# Patient Record
Sex: Male | Born: 1935 | Race: White | Hispanic: No | Marital: Married | Smoking: Never smoker
Health system: Southern US, Community
[De-identification: ages and names within clinical notes are randomized; demographics above are authoritative.]

## PROBLEM LIST (undated history)

## (undated) DIAGNOSIS — Z950 Presence of cardiac pacemaker: Secondary | ICD-10-CM

## (undated) DIAGNOSIS — I441 Atrioventricular block, second degree: Secondary | ICD-10-CM

## (undated) DIAGNOSIS — E669 Obesity, unspecified: Secondary | ICD-10-CM

## (undated) DIAGNOSIS — N200 Calculus of kidney: Secondary | ICD-10-CM

## (undated) DIAGNOSIS — I10 Essential (primary) hypertension: Secondary | ICD-10-CM

## (undated) DIAGNOSIS — E119 Type 2 diabetes mellitus without complications: Secondary | ICD-10-CM

## (undated) DIAGNOSIS — E785 Hyperlipidemia, unspecified: Secondary | ICD-10-CM

## (undated) DIAGNOSIS — G600 Hereditary motor and sensory neuropathy: Secondary | ICD-10-CM

## (undated) HISTORY — DX: Calculus of kidney: N20.0

## (undated) HISTORY — DX: Obesity, unspecified: E66.9

## (undated) HISTORY — DX: Essential (primary) hypertension: I10

## (undated) HISTORY — DX: Presence of cardiac pacemaker: Z95.0

## (undated) HISTORY — DX: Atrioventricular block, second degree: I44.1

## (undated) HISTORY — DX: Type 2 diabetes mellitus without complications: E11.9

## (undated) HISTORY — PX: SHOULDER ARTHROSCOPY: SHX128

## (undated) HISTORY — PX: KIDNEY STONE SURGERY: SHX686

## (undated) HISTORY — DX: Hyperlipidemia, unspecified: E78.5

## (undated) HISTORY — DX: Hereditary motor and sensory neuropathy: G60.0

---

## 1999-07-05 ENCOUNTER — Ambulatory Visit (HOSPITAL_BASED_OUTPATIENT_CLINIC_OR_DEPARTMENT_OTHER): Admission: RE | Admit: 1999-07-05 | Discharge: 1999-07-05 | Payer: Self-pay | Admitting: Otolaryngology

## 2004-05-23 ENCOUNTER — Ambulatory Visit (HOSPITAL_COMMUNITY): Admission: RE | Admit: 2004-05-23 | Discharge: 2004-05-23 | Payer: Self-pay | Admitting: Orthopedic Surgery

## 2004-05-23 ENCOUNTER — Ambulatory Visit (HOSPITAL_BASED_OUTPATIENT_CLINIC_OR_DEPARTMENT_OTHER): Admission: RE | Admit: 2004-05-23 | Discharge: 2004-05-23 | Payer: Self-pay | Admitting: Orthopedic Surgery

## 2005-02-21 ENCOUNTER — Ambulatory Visit (HOSPITAL_COMMUNITY): Admission: RE | Admit: 2005-02-21 | Discharge: 2005-02-21 | Payer: Self-pay | Admitting: Gastroenterology

## 2005-02-21 ENCOUNTER — Encounter (INDEPENDENT_AMBULATORY_CARE_PROVIDER_SITE_OTHER): Payer: Self-pay | Admitting: Specialist

## 2007-06-06 ENCOUNTER — Ambulatory Visit (HOSPITAL_COMMUNITY): Admission: RE | Admit: 2007-06-06 | Discharge: 2007-06-06 | Payer: Self-pay | Admitting: Urology

## 2007-06-20 ENCOUNTER — Ambulatory Visit (HOSPITAL_COMMUNITY): Admission: RE | Admit: 2007-06-20 | Discharge: 2007-06-20 | Payer: Self-pay | Admitting: Urology

## 2007-06-26 ENCOUNTER — Emergency Department (HOSPITAL_COMMUNITY): Admission: EM | Admit: 2007-06-26 | Discharge: 2007-06-26 | Payer: Self-pay | Admitting: Emergency Medicine

## 2011-05-02 NOTE — Op Note (Signed)
NAME:  Calvin Kemp, Calvin Kemp               ACCOUNT NO.:  [id number]   MEDICAL RECORD NO.:  [id number]          PATIENT TYPE:  AMB   LOCATION:  DAY                          FACILITY:  [facility]   PHYSICIAN:  Mark C. Vernie Ammons, M.D.  DATE OF BIRTH:  [birth year]   DATE OF PROCEDURE:  06/06/2007  DATE OF DISCHARGE:                               OPERATIVE REPORT   PREOPERATIVE DIAGNOSIS:  Right ureteral calculus.   POSTOPERATIVE DIAGNOSES:  1. Right ureteral calculus.  2. Right mid ureteral stricture.   PROCEDURE:  Cystoscopy, right retrograde pyelogram with interpretation,  right ureteroscopy and double-J stent placement.   SURGEON:  Mark C. Vernie Ammons, M.D.   ANESTHESIA:  General.   DRAINS:  6-French 26 cm double-J stent the right ureter.  No string.   SPECIMENS:  None.   BLOOD LOSS:  Minimal.   COMPLICATIONS:  None.   INDICATIONS:  The patient is a 75 year old white male with a known right  ureteral calculus.  It was initially demonstrated on CT scan to be 4 mm  in size and in the right proximal ureter with hydronephrosis.  He had  had persistent pain and is therefore brought to the OR today for  ureteroscopic extraction of the stone.  The risks, complications and  alternatives were discussed, he understands and elected to proceed.   DESCRIPTION OF OPERATION:  After informed consent, the patient brought  to major OR, placed table, administered general anesthesia and moved to  the dorsal lithotomy position.  His genitalia was sterilely prepped and  draped and a 6-French rigid ureteroscope was then passed per urethra  into the bladder.  The bladder was inspected and noted be free of any  tumor, stones or inflammatory lesions.  The right orifice was identified  and the scope was easily passed into the right orifice and up several  centimeters in the right ureter.  A stone was not visualized.   Through the ureteroscope I performed retrograde pyelogram in standard  fashioned using full  strength contrast.  The contrast was injected  through the scope and under direct fluoroscopic visualization I noted  the contrast to pass through an area that I thought was crossing of the  iliac vessels but it seemed a little high.  It was just at the lower  border of the sacrum where there appeared to be narrowed area in the  ureter.  Proximal to that there was some hydronephrosis.  I did not  definitely discern the stone as a filling defect due to body habitus on  fluoroscopy.  There is pyelocaliectasis seen but no filling defects or  mass effect of the kidney.   I passed a 0.038 inch floppy tip guidewire up the right ureter under  direct fluoroscopic control.  I then withdrew the ureteroscope.  I was  removed the ureteroscope and then reinserted the ureteroscope next to  the guidewire and a second guidewire was used as a guide to attempt to  pass the scope up the ureter but it met a great deal of resistance at  the level previously noted at the lower border of  the sacrum.  I  therefore removed the ureteroscope and passed a ureteral dilating  balloon over the guidewire, however, I could not get the dilating  balloon to pass through the strictured area.  I therefore removed the  dilating balloon and used a ureteral access sheath and then used just  the inner cannula, passed this over the guidewire and it too held up in  what appears to be a very tight stricture of the ureter.  I could not  easily pass this over the guidewire on up the ureter so I therefore  removed it and reinserted the ureteroscope and tried to visualize the  area.  I could not see any definite stone impacted there and it appeared  to just be stricture.  I passed the dilating balloon catheter again over  the guidewire next, next to the ureteroscope and tried to visualize  passing this through the strictured region but was unsuccessful.   I removed the ureteroscope and the dilating balloon as well as one of  the  guidewires and back loaded the cystoscope over the other guidewire  and passed this.  I then inserted the cystoscope in the bladder and  passed the double-J stent over the guidewire into the area of the renal  pelvis without difficulty and then removed the guidewire with good curl  being noted in the bladder and the renal pelvis region.  I drained the  bladder and removed the cystoscope and the patient was awakened and  taken to recovery room in stable satisfactory condition.  He tolerated  procedure well with no intraoperative complications.   He will be given a prescription for 10 mg Toradol  #24.  He will follow-  up in the office to see Dr. Annabell Howells or myself in 1 week with plan to allow  the stent to hopefully soften the strictured region and allow reattempt  at ureteroscopy and stone extraction at that time.      Mark C. Vernie Ammons, M.D.  Electronically Signed     MCO/MEDQ  D:  06/06/2007  T:  06/07/2007  Job:  [id number]

## 2011-05-05 NOTE — Op Note (Signed)
NAME:  Calvin Kemp, Calvin Kemp NO.:  [id number]   MEDICAL RECORD NO.:  [id number]          PATIENT TYPE:  AMB   LOCATION:  ENDO                         FACILITY:  [facility]   PHYSICIAN:  Graylin Shiver, M.D.   DATE OF BIRTH:  01-07-1936   DATE OF PROCEDURE:  02/21/2005  DATE OF DISCHARGE:                                 OPERATIVE REPORT   PROCEDURE:  Colonoscopy with biopsy.   INDICATIONS FOR PROCEDURE:  Screening.   Informed consent was obtained after explantation of the risks of bleeding,  infection, and perforation.   PREMEDICATIONS:  1.  Fentanyl 100 mcg IV.  2.  Versed 10 mg IV.   PROCEDURE:  With the patient in the left lateral decubitus position, a  rectal exam was performed.  No masses were felt. The Olympus colonoscope was  inserted into the rectum and advanced around the colon to the cecum.  Cecal  landmarks were identified.  The cecum and ascending colon were normal, the  transverse colon normal.  The descending colon and sigmoid revealed  diverticulosis.  In the distal sigmoid, there were two tiny polyps that were  biopsied off with cold forceps.  The rectum was normal.  He tolerated the  procedure well, without complications.   IMPRESSION:  1.  Diverticulosis.  2.  Two tiny polyps in the sigmoid which were biopsied off.   PLAN:  The pathology will be checked.      SFG/MEDQ  D:  02/21/2005  T:  02/21/2005  Job:  [id number]   cc:   Chales Salmon. Abigail Miyamoto, M.D.  [address]  [place]  [place] [address]  Fax: [phone]

## 2011-05-05 NOTE — Op Note (Signed)
NAME:  Calvin Kemp, Calvin Kemp                         ACCOUNT NO.:  [id number]   MEDICAL RECORD NO.:  [id number]                   PATIENT TYPE:  AMB   LOCATION:  DSC                                  FACILITY:  [facility]   PHYSICIAN:  Bralon A. Thurston Hole, M.D.              DATE OF BIRTH:  [birth year]   DATE OF PROCEDURE:  05/23/2004  DATE OF DISCHARGE:                                 OPERATIVE REPORT   PREOPERATIVE DIAGNOSIS:  Right shoulder partial rotator cuff tear with  labrum tear and impingement.   POSTOPERATIVE DIAGNOSES:  1. Right shoulder partial labrum tear.  2. Right shoulder impingement.  3. Right shoulder acromioclavicular joint spurring-degenerative joint     disease.   PROCEDURES:  1. Right shoulder examination under anesthesia, followed by arthroscopic     debridement, partial labral tear.  2. Right shoulder subacromial decompression.  3. Right shoulder distal clavicle excision.   SURGEON:  Elana Alm. Thurston Hole, M.D.   ASSISTANT:  Julien Girt, P.A.   ANESTHESIA:  General.   OPERATIVE TIME:  45 minutes.   COMPLICATIONS:  None.   INDICATION FOR PROCEDURE:  Calvin Kemp is a 75 year old gentleman who has had  a year of right shoulder pain increasing in nature with exam indicative of  and x-ray showing significant spurring and impingement, subacromial space  and AC joint consistent with rotator cuff tendinitis and impingement, who  has failed conservative care and is now to undergo arthroscopy.   DESCRIPTION:  Calvin Kemp was brought to the operating room on May 23, 2004,  after an interscalene block had been placed in the holding room by  anesthesia for postoperative pain control.  He was placed on the operating  table in the supine position.  After being placed under general anesthesia,  his right shoulder was examined under anesthesia.  He had full range of  motion and the shoulder was stable to ligamentous exam.  He was then placed  in a beach chair position and his  shoulder and arm were prepped using  sterile Duraprep and draped using sterile technique.  Originally through a  posterior arthroscopic portal, the arthroscope with a pump attached was  placed in through an anterior portal, and an arthroscopic probe was placed.  On initial inspection the articular cartilage in the glenohumeral joint  showed mild grade 2 and 3 chondromalacia, which was debrided.  He had  partial tearing of the anterior, superior, inferior, and posterior labrum,  25-30%, and this was debrided.  The biceps tendon anchor and biceps tendon  were intact.  Anterior inferior glenohumeral ligament complex was intact.  Rotator cuff was thoroughly inspected, and there was no evidence of a tear  on the articular surface.  Inferior capsular recess was free of pathology.  Subacromial space was entered and a lateral arthroscopic portal was made.  A  moderately thickened bursitis was resected.  The rotator cuff was somewhat  inflamed and  thickened underneath this but no evidence of a tear.  Impingement was noted, and a subacromial decompression was carried out as  well as a CA ligament release.  The [facility] joint showed significant spurring and  degenerative changes, and the distal 5-6 mm of clavicle was resected with a  6 mm bur.  After this was done, the shoulder could be brought through a full  range of motion with no impingement on the rotator cuff.  At this point it  was felt that all pathology had been satisfactorily addressed.  The  instruments were removed.  Portals closed with 3-0 nylon suture, sterile  dressings and a sling applied, the patient was awakened and taken to  recovery in stable condition.   FOLLOW-UP CARE:  Calvin Kemp will be followed as an outpatient on Vicodin and  Naprosyn with early physical therapy.  See him back in my office in a week  for sutures out and follow-up.                                               Prentice A. Thurston Hole, M.D.    RAW/MEDQ  D:   05/23/2004  T:  05/23/2004  Job:  [id number]

## 2011-09-21 ENCOUNTER — Ambulatory Visit: Payer: Self-pay | Admitting: Internal Medicine

## 2011-10-02 ENCOUNTER — Institutional Professional Consult (permissible substitution): Payer: Self-pay | Admitting: Internal Medicine

## 2011-10-04 LAB — HEMOGLOBIN AND HEMATOCRIT, BLOOD
HCT: 39.3
Hemoglobin: 13.1

## 2011-10-04 LAB — BASIC METABOLIC PANEL
BUN: 17
CO2: 23
Calcium: 8.7
Chloride: 106
Creatinine, Ser: 1.72 — ABNORMAL HIGH
GFR calc Af Amer: 48 — ABNORMAL LOW
GFR calc non Af Amer: 40 — ABNORMAL LOW
Glucose, Bld: 211 — ABNORMAL HIGH
Potassium: 3.9
Sodium: 137

## 2011-10-26 ENCOUNTER — Telehealth: Payer: Self-pay | Admitting: Internal Medicine

## 2011-10-26 ENCOUNTER — Institutional Professional Consult (permissible substitution): Payer: Self-pay | Admitting: Internal Medicine

## 2011-10-26 ENCOUNTER — Ambulatory Visit: Payer: Self-pay | Admitting: Internal Medicine

## 2011-10-26 NOTE — Telephone Encounter (Signed)
Discussed with Dr Johney Frame  Pt has had 2 appointments scheduled and cancel both  We can reschedule but it will be next available  If he has problems prior he can go to the ER

## 2011-10-26 NOTE — Telephone Encounter (Signed)
New message:  Pt had appt but cancelled and then passed out last night at home.  Now he is wanting to be seen ASAP.  Please call and advise.

## 2011-11-15 ENCOUNTER — Ambulatory Visit (INDEPENDENT_AMBULATORY_CARE_PROVIDER_SITE_OTHER): Payer: Medicare Other | Admitting: Internal Medicine

## 2011-11-15 ENCOUNTER — Encounter: Payer: Self-pay | Admitting: *Deleted

## 2011-11-15 ENCOUNTER — Encounter: Payer: Self-pay | Admitting: Internal Medicine

## 2011-11-15 VITALS — BP 148/62 | HR 52 | Resp 18 | Ht 72.0 in | Wt 218.4 lb

## 2011-11-15 DIAGNOSIS — I442 Atrioventricular block, complete: Secondary | ICD-10-CM | POA: Insufficient documentation

## 2011-11-15 DIAGNOSIS — I459 Conduction disorder, unspecified: Secondary | ICD-10-CM

## 2011-11-15 DIAGNOSIS — I441 Atrioventricular block, second degree: Secondary | ICD-10-CM

## 2011-11-15 NOTE — Assessment & Plan Note (Signed)
The patient has mobitz II second degree AV block.  He has frequent 2:1 AV conduction including on his EKG today.  He has had multiple episodes of presyncope and also an episode of syncope recently.  He has a clear indication for pacemaker implantation at this time. I have strongly recommended pacemaker implantation urgently (within the next 48 hours).  I have also made it very clear that he could have further syncope resulting in head/neck injury, hip fraction, etc.  I have explained that his AV conduction is at this point not reliable and could result in sudden death.  Risks, benefits, alternatives to pacemaker implantation were discussed in detail with the patient and his spouse today. The patient understands that the risks include but are not limited to bleeding, infection, pneumothorax, perforation, tamponade, vascular damage, renal failure, MI, stroke, death,  and lead dislodgement and wishes to proceed.  Unfortunately, I could not convince him to have the procedure done urgently (as I have strongly recommended).  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  I offered to have the procedure done this week and that he could recover over the weekend and likely be back to work by Monday.  Unfortunately, he declines.  He has agreed to proceed with pacemaker implantation on Dec 13th.  I have instructed him to not drive in there interim. He is instructed to report immediately to [facility] for urgent [facility]PM should further presyncope or syncope occur.

## 2011-11-15 NOTE — Progress Notes (Signed)
Primary Care Physician: ROSS,CHARLES ALAN, MD Referring Physician:  Dr Skains   Calvin Kemp is a 75 y.o. male with a h/o DM and HTN who presents today for EP consultation.  He reports being in good healthy.  He was working on a ladder 8/12.  He began having episodes of a "rushing" sensation.  He describes this as a warmth with presyncope.  He began having episodes of similar presyncope 1-2 times per week.  Episodes typically lasts only several seconds.  He was evaluated by Dr Skains and had a 24 hour monitor placed.  This documented episodes of mobitz II second degree AV block with occasional 2:1 AV block.  He was encouraged to proceed with pacemaker implantation but declined.  He did pretty well until about 2 weeks ago when he had syncope.  He reports that he was walking into his bathroom when he felt the same "rush".  He says that the next thing he knew, he was getting up off of the floor after passing out.  He has had no further syncope but reports presyncope.  Today, he denies symptoms of palpitations, chest pain, shortness of breath, orthopnea, PND, or lower extremity edema. The patient is tolerating medications without difficulties and is otherwise without complaint today.   Past Medical History  Diagnosis Date  . DM2 (diabetes mellitus, type 2)   . Obesity   . HTN (hypertension)   . HLD (hyperlipidemia)   . Nephrolithiasis   . Charcot Marie tooth muscular atrophy     wears braces on his legs  . Second degree Mobitz II AV block    Past Surgical History  Procedure Date  . Kidney stone surgery   . Shoulder arthroscopy     Current Outpatient Prescriptions  Medication Sig Dispense Refill  . aspirin 81 MG tablet Take 81 mg by mouth daily.        . atorvastatin (LIPITOR) 20 MG tablet Take 20 mg by mouth daily.        . BALANCED B COMPLEX CR PO Take 1 capsule by mouth as directed.        . Calcium-Magnesium (CAL-MAG) 500-250 MG TABS Take 1 tablet by mouth daily.        . CHONDROITIN  SULFATE PO Take 1 tablet by mouth as directed.        . CHROMIUM ASPARTATE PO Take by mouth.        . co-enzyme Q-10 30 MG capsule Take 50 mg by mouth 3 (three) times daily.        . colesevelam (WELCHOL) 625 MG tablet Take 625 mg by mouth daily.        . folic acid (FOLVITE) 1 MG tablet Take 1 mg by mouth daily.        . Glucosamine HCl-Glucosamin SO4 (GLUCOSAMINE COMPLEX PO) Take 1 capsule by mouth 3 (three) times daily.        . levothyroxine (SYNTHROID, LEVOTHROID) 50 MCG tablet Take 50 mcg by mouth daily.        . linagliptin (TRADJENTA) 5 MG TABS tablet Take 5 mg by mouth daily.        . lisinopril (PRINIVIL,ZESTRIL) 20 MG tablet Take 20 mg by mouth daily.        . metFORMIN (GLUCOPHAGE) 500 MG tablet Take 500 mg by mouth 2 (two) times daily with a meal.        . Multiple Vitamin (MULTIVITAMIN) capsule Take 1 capsule by mouth daily.        .   Omega-3 Fatty Acids (EPA PO) Take 1 capsule by mouth daily.        . repaglinide (PRANDIN) 0.5 MG tablet Take 0.5 mg by mouth 3 (three) times daily before meals.          No Known Allergies  History   Social History  . Marital Status: Married    Spouse Name: N/A    Number of Children: N/A  . Years of Education: N/A   Occupational History  . 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 Smoking status: Never Smoker   . Smokeless tobacco: Not on file  . Alcohol Use: Yes     1- 2 a week.  . Drug Use: No  . Sexually Active: Not on file   Other Topics Concern  . Not on file   Social History Narrative   Lives with spouse.  Children are grown and healthy.Retired from [employer] and now teaches political college at [employer].    Family History  Problem Relation Age of Onset  . Cancer    . Diabetes      ROS- All systems are reviewed and negative except as per the HPI above  Physical Exam: Filed Vitals:   11/15/11 1528  BP: 148/62  Pulse: 52  Resp: 18  Height: 6' (1.829 m)  Weight: 218 lb 6.4  oz (99.066 kg)    GEN- The patient is well appearing, alert and oriented x 3 today.   Head- normocephalic, atraumatic Eyes-  Sclera clear, conjunctiva pink Ears- hearing intact Oropharynx- clear Neck- supple, no JVP Lymph- no cervical lymphadenopathy Lungs- Clear to ausculation bilaterally, normal work of breathing Heart- Regular rate and rhythm, no murmurs, rubs or gallops, PMI not laterally displaced GI- soft, NT, ND, + BS Extremities- no clubbing, cyanosis, or edema MS- no significant deformity or atrophy Skin- no rash or lesion Psych- euthymic mood, full affect Neuro- strength and sensation are intact  EKG today reveals sinus rhythm with 2:1 AV block, conducted PR is 214, RBBB, LAHB with diffuse TWI, LVH Holter monitor 09/01/11- sinus with mobitz II second degree AV block and frequent 2:1 AV block Echo 09/07/11- EF 60-65%, no significant valvular disease TSH 9.1, T4 0.69  Assessment and Plan:  

## 2011-11-15 NOTE — Patient Instructions (Signed)

## 2011-11-16 ENCOUNTER — Encounter (HOSPITAL_COMMUNITY): Payer: Self-pay | Admitting: Pharmacy Technician

## 2011-11-16 ENCOUNTER — Telehealth: Payer: Self-pay | Admitting: Internal Medicine

## 2011-11-16 NOTE — Telephone Encounter (Signed)
FU Call: Pt calling to speak with Tresa Endo with questions regarding pt pacemaker surgery. Please return pt call to discuss further.

## 2011-11-16 NOTE — Telephone Encounter (Signed)
Spoke with patient He is going to call us back on Monday and let me know where he will be having his pre-procedure labs drawn  It will either be here or at [facility] on 11/23/11

## 2011-11-23 ENCOUNTER — Telehealth: Payer: Self-pay | Admitting: Internal Medicine

## 2011-11-23 NOTE — Telephone Encounter (Signed)
Patient called and did not want me to mention to my kids about his procedure.  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

## 2011-11-23 NOTE — Telephone Encounter (Signed)
New message: pt called and would not leave a message. Pl call him

## 2011-11-27 ENCOUNTER — Other Ambulatory Visit: Payer: Self-pay | Admitting: *Deleted

## 2011-11-27 DIAGNOSIS — I441 Atrioventricular block, second degree: Secondary | ICD-10-CM

## 2011-11-29 MED ORDER — SODIUM CHLORIDE 0.9 % IV SOLN
INTRAVENOUS | Status: DC
  Administered 2011-11-30: 13:00:00 via INTRAVENOUS

## 2011-11-29 MED ORDER — SODIUM CHLORIDE 0.9 % IR SOLN
80.0000 mg | Status: DC
  Filled 2011-11-29: qty 2

## 2011-11-29 MED ORDER — CEFAZOLIN SODIUM-DEXTROSE 2-3 GM-% IV SOLR
2.0000 g | INTRAVENOUS | Status: DC
  Filled 2011-11-29: qty 50

## 2011-11-29 MED ORDER — SODIUM CHLORIDE 0.45 % IV SOLN
INTRAVENOUS | Status: DC
  Administered 2011-11-30: 13:00:00 via INTRAVENOUS

## 2011-11-30 ENCOUNTER — Ambulatory Visit (HOSPITAL_COMMUNITY): Payer: Medicare Other

## 2011-11-30 ENCOUNTER — Telehealth: Payer: Self-pay | Admitting: Internal Medicine

## 2011-11-30 ENCOUNTER — Ambulatory Visit (HOSPITAL_COMMUNITY)
Admission: RE | Admit: 2011-11-30 | Discharge: 2011-12-01 | Disposition: A | Discharge status: Home / Self Care | Payer: Medicare Other | Source: Ambulatory Visit | Attending: Internal Medicine | Admitting: Internal Medicine

## 2011-11-30 ENCOUNTER — Encounter (HOSPITAL_COMMUNITY)
Admission: RE | Disposition: A | Discharge status: Home / Self Care | Payer: Self-pay | Source: Ambulatory Visit | Attending: Internal Medicine

## 2011-11-30 DIAGNOSIS — I441 Atrioventricular block, second degree: Secondary | ICD-10-CM

## 2011-11-30 DIAGNOSIS — E669 Obesity, unspecified: Secondary | ICD-10-CM | POA: Insufficient documentation

## 2011-11-30 DIAGNOSIS — E785 Hyperlipidemia, unspecified: Secondary | ICD-10-CM | POA: Insufficient documentation

## 2011-11-30 DIAGNOSIS — E119 Type 2 diabetes mellitus without complications: Secondary | ICD-10-CM | POA: Insufficient documentation

## 2011-11-30 DIAGNOSIS — I442 Atrioventricular block, complete: Secondary | ICD-10-CM | POA: Insufficient documentation

## 2011-11-30 DIAGNOSIS — I10 Essential (primary) hypertension: Secondary | ICD-10-CM | POA: Insufficient documentation

## 2011-11-30 HISTORY — PX: PERMANENT PACEMAKER INSERTION: SHX5480

## 2011-11-30 HISTORY — PX: PACEMAKER INSERTION: SHX728

## 2011-11-30 LAB — CBC
HCT: 40.9 % (ref 39.0–52.0)
Hemoglobin: 13.7 g/dL (ref 13.0–17.0)
RBC: 4.48 MIL/uL (ref 4.22–5.81)
RDW: 13.5 % (ref 11.5–15.5)
WBC: 10.2 10*3/uL (ref 4.0–10.5)

## 2011-11-30 LAB — BASIC METABOLIC PANEL
BUN: 15 mg/dL (ref 6–23)
Chloride: 109 mEq/L (ref 96–112)
GFR calc Af Amer: >90 mL/min (ref >90)
Potassium: 4.9 mEq/L (ref 3.5–5.1)

## 2011-11-30 LAB — GLUCOSE, CAPILLARY: Glucose-Capillary: 265 mg/dL — ABNORMAL HIGH (ref 70–99)

## 2011-11-30 LAB — PROTIME-INR: Prothrombin Time: 14.2 seconds (ref 11.6–15.2)

## 2011-11-30 LAB — APTT: aPTT: 30 seconds (ref 24–37)

## 2011-11-30 LAB — SURGICAL PCR SCREEN: MRSA, PCR: NEGATIVE

## 2011-11-30 SURGERY — PERMANENT PACEMAKER INSERTION
Anesthesia: LOCAL

## 2011-11-30 MED ORDER — CHLORHEXIDINE GLUCONATE 4 % EX LIQD
60.0000 mL | Freq: Once | CUTANEOUS | Status: DC
  Filled 2011-11-30: qty 60

## 2011-11-30 MED ORDER — SODIUM CHLORIDE 0.9 % IJ SOLN
3.0000 mL | Freq: Two times a day (BID) | INTRAMUSCULAR | Status: DC
  Administered 2011-11-30: 3 mL via INTRAVENOUS

## 2011-11-30 MED ORDER — CEFAZOLIN SODIUM 1-5 GM-% IV SOLN
1.0000 g | Freq: Four times a day (QID) | INTRAVENOUS | Status: AC
  Administered 2011-11-30 – 2011-12-01 (×3): 1 g via INTRAVENOUS
  Filled 2011-11-30 (×3): qty 50

## 2011-11-30 MED ORDER — LIDOCAINE HCL (PF) 1 % IJ SOLN
INTRAMUSCULAR | Status: AC
  Filled 2011-11-30: qty 60

## 2011-11-30 MED ORDER — ONDANSETRON HCL 4 MG/2ML IJ SOLN: 4.0000 mg | Freq: Four times a day (QID) | INTRAMUSCULAR | Status: DC | PRN

## 2011-11-30 MED ORDER — ASPIRIN EC 81 MG PO TBEC
81.0000 mg | DELAYED_RELEASE_TABLET | Freq: Every day | ORAL | Status: DC
  Filled 2011-11-30: qty 1

## 2011-11-30 MED ORDER — MIDAZOLAM HCL 5 MG/5ML IJ SOLN
INTRAMUSCULAR | Status: AC
  Filled 2011-11-30: qty 5

## 2011-11-30 MED ORDER — LINAGLIPTIN 5 MG PO TABS
5.0000 mg | ORAL_TABLET | Freq: Every day | ORAL | Status: DC
  Filled 2011-11-30: qty 1

## 2011-11-30 MED ORDER — CEFAZOLIN SODIUM 1-5 GM-% IV SOLN
INTRAVENOUS | Status: AC
  Filled 2011-11-30: qty 100

## 2011-11-30 MED ORDER — HEPARIN (PORCINE) IN NACL 2-0.9 UNIT/ML-% IJ SOLN
INTRAMUSCULAR | Status: AC
  Filled 2011-11-30: qty 1000

## 2011-11-30 MED ORDER — LISINOPRIL 20 MG PO TABS
20.0000 mg | ORAL_TABLET | Freq: Every day | ORAL | Status: DC
  Filled 2011-11-30 (×2): qty 1

## 2011-11-30 MED ORDER — HYDROCODONE-ACETAMINOPHEN 5-325 MG PO TABS: 1.0000 | ORAL_TABLET | ORAL | Status: DC | PRN

## 2011-11-30 MED ORDER — MUPIROCIN 2 % EX OINT
TOPICAL_OINTMENT | Freq: Once | CUTANEOUS | Status: AC
  Administered 2011-11-30: 1 via NASAL

## 2011-11-30 MED ORDER — CEFAZOLIN SODIUM-DEXTROSE 2-3 GM-% IV SOLR
INTRAVENOUS | Status: AC
  Filled 2011-11-30: qty 50

## 2011-11-30 MED ORDER — REPAGLINIDE 0.5 MG PO TABS
0.5000 mg | ORAL_TABLET | Freq: Three times a day (TID) | ORAL | Status: DC
  Filled 2011-11-30 (×5): qty 1

## 2011-11-30 MED ORDER — FENTANYL CITRATE 0.05 MG/ML IJ SOLN
INTRAMUSCULAR | Status: AC
  Filled 2011-11-30: qty 2

## 2011-11-30 MED ORDER — LEVOTHYROXINE SODIUM 50 MCG PO TABS
50.0000 ug | ORAL_TABLET | Freq: Every day | ORAL | Status: DC
  Filled 2011-11-30 (×2): qty 1

## 2011-11-30 MED ORDER — INSULIN ASPART 100 UNIT/ML ~~LOC~~ SOLN
0.0000 [IU] | Freq: Three times a day (TID) | SUBCUTANEOUS | Status: DC
  Administered 2011-11-30: 8 [IU] via SUBCUTANEOUS
  Administered 2011-12-01: 3 [IU] via SUBCUTANEOUS

## 2011-11-30 MED ORDER — INSULIN ASPART 100 UNIT/ML ~~LOC~~ SOLN
0.0000 [IU] | Freq: Every day | SUBCUTANEOUS | Status: DC
  Administered 2011-11-30: 3 [IU] via SUBCUTANEOUS
  Filled 2011-11-30: qty 3

## 2011-11-30 MED ORDER — SODIUM CHLORIDE 0.9 % IJ SOLN: 3.0000 mL | INTRAMUSCULAR | Status: DC | PRN

## 2011-11-30 MED ORDER — ACETAMINOPHEN 325 MG PO TABS
325.0000 mg | ORAL_TABLET | ORAL | Status: DC | PRN
  Filled 2011-11-30: qty 2

## 2011-11-30 MED ORDER — MUPIROCIN 2 % EX OINT
TOPICAL_OINTMENT | CUTANEOUS | Status: AC
  Administered 2011-11-30: 1 via NASAL
  Filled 2011-11-30: qty 22

## 2011-11-30 MED ORDER — GUAIFENESIN-DM 100-10 MG/5ML PO SYRP
15.0000 mL | ORAL_SOLUTION | ORAL | Status: DC | PRN
  Administered 2011-12-01: 15 mL via ORAL
  Filled 2011-11-30: qty 10
  Filled 2011-11-30: qty 5

## 2011-11-30 MED ORDER — ACETAMINOPHEN 500 MG PO TABS
1000.0000 mg | ORAL_TABLET | Freq: Four times a day (QID) | ORAL | Status: DC
  Administered 2011-11-30 – 2011-12-01 (×3): 1000 mg via ORAL
  Filled 2011-11-30 (×4): qty 2

## 2011-11-30 NOTE — Telephone Encounter (Signed)
New message:  Pt received call from hospital changing the time of his procedure.  Was told to come in as soon as possible today.  Please confirm and call patient back.  Spoke to [employer] and confirmed/patient advised/glc

## 2011-11-30 NOTE — Interval H&P Note (Signed)
History and Physical Interval Note:  11/30/2011 12:19 PM  Calvin Kemp  has presented today for surgery, with the diagnosis of mobitz II second degree AV block. The various methods of treatment have been discussed with the patient and family. After consideration of risks, benefits and other options for treatment, the patient has consented to  Procedure(s): PERMANENT PACEMAKER INSERTION as a surgical intervention .  The patients' history has been reviewed, patient examined, no change in status, stable for surgery.  I have reviewed the patients' chart and labs.  Questions were answered to the patient's satisfaction.     Hillis Range

## 2011-11-30 NOTE — H&P (View-Only) (Signed)
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  He reports being in good healthy.  He was working on a ladder 8/12.  He began having episodes of a "rushing" sensation.  He describes this as a warmth with presyncope.  He began having episodes of similar presyncope 1-2 times per week.  Episodes typically lasts only several seconds.  He was evaluated by Calvin Anne Fu and had a 24 hour monitor placed.  This documented episodes of mobitz II second degree AV block with occasional 2:1 AV block.  He was encouraged to proceed with pacemaker implantation but declined.  He did pretty well until about 2 weeks ago when he had syncope.  He reports that he was walking into his bathroom when he felt the same "rush".  He says that the next thing he knew, he was getting up off of the floor after passing out.  He has had no further syncope but reports presyncope.  Today, he denies symptoms of palpitations, chest pain, shortness of breath, orthopnea, PND, or lower extremity edema. The patient is tolerating medications without difficulties and is otherwise without complaint today.   Past Medical History  Diagnosis Date  . DM2 (diabetes mellitus, type 2)   . Obesity   . HTN (hypertension)   . HLD (hyperlipidemia)   . Nephrolithiasis   . Charcot Hilda Lias tooth muscular atrophy     wears braces on his legs  . Second degree Mobitz II AV block    Past Surgical History  Procedure Date  . Kidney stone surgery   . Shoulder arthroscopy     Current Outpatient Prescriptions  Medication Sig Dispense Refill  . aspirin 81 MG tablet Take 81 mg by mouth daily.        Marland Kitchen atorvastatin (LIPITOR) 20 MG tablet Take 20 mg by mouth daily.        Marland Kitchen BALANCED B COMPLEX CR PO Take 1 capsule by mouth as directed.        . Calcium-Magnesium (CAL-MAG) 500-250 MG TABS Take 1 tablet by mouth daily.        . CHONDROITIN  SULFATE PO Take 1 tablet by mouth as directed.        . CHROMIUM ASPARTATE PO Take by mouth.        . co-enzyme Q-10 30 MG capsule Take 50 mg by mouth 3 (three) times daily.        . colesevelam (WELCHOL) 625 MG tablet Take 625 mg by mouth daily.        . folic acid (FOLVITE) 1 MG tablet Take 1 mg by mouth daily.        . Glucosamine HCl-Glucosamin SO4 (GLUCOSAMINE COMPLEX PO) Take 1 capsule by mouth 3 (three) times daily.        Marland Kitchen levothyroxine (SYNTHROID, LEVOTHROID) 50 MCG tablet Take 50 mcg by mouth daily.        Marland Kitchen linagliptin (TRADJENTA) 5 MG TABS tablet Take 5 mg by mouth daily.        Marland Kitchen lisinopril (PRINIVIL,ZESTRIL) 20 MG tablet Take 20 mg by mouth daily.        . metFORMIN (GLUCOPHAGE) 500 MG tablet Take 500 mg by mouth 2 (two) times daily with a meal.        . Multiple Vitamin (MULTIVITAMIN) capsule Take 1 capsule by mouth daily.        Marland Kitchen  Omega-3 Fatty Acids (EPA PO) Take 1 capsule by mouth daily.        . repaglinide (PRANDIN) 0.5 MG tablet Take 0.5 mg by mouth 3 (three) times daily before meals.          No Known Allergies  History   Social History  . Marital Status: Married    Spouse Name: N/A    Number of Children: N/A  . Years of Education: N/A   Occupational History  . teacher Database administrator   Social History Main Topics  . Smoking status: Never Smoker   . Smokeless tobacco: Not on file  . Alcohol Use: Yes     1- 2 a week.  . Drug Use: No  . Sexually Active: Not on file   Other Topics Concern  . Not on file   Social History Narrative   Lives with spouse.  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    Family History  Problem Relation Age of Onset  . Cancer    . Diabetes      ROS- All systems are reviewed and negative except as per the HPI above  Physical Exam: Filed Vitals:   11/15/11 1528  BP: 148/62  Pulse: 52  Resp: 18  Height: 6' (1.829 m)  Weight: 218 lb 6.4  oz (99.066 kg)    GEN- The patient is well appearing, alert and oriented x 3 today.   Head- normocephalic, atraumatic Eyes-  Sclera clear, conjunctiva pink Ears- hearing intact Oropharynx- clear Neck- supple, no JVP Lymph- no cervical lymphadenopathy Lungs- Clear to ausculation bilaterally, normal work of breathing Heart- Regular rate and rhythm, no murmurs, rubs or gallops, PMI not laterally displaced GI- soft, NT, ND, + BS Extremities- no clubbing, cyanosis, or edema MS- no significant deformity or atrophy Skin- no rash or lesion Psych- euthymic mood, full affect Neuro- strength and sensation are intact  EKG today reveals sinus rhythm with 2:1 AV block, conducted PR is 214, RBBB, LAHB with diffuse TWI, LVH Holter monitor 09/01/11- sinus with mobitz II second degree AV block and frequent 2:1 AV block Echo 09/07/11- EF 60-65%, no significant valvular disease TSH 9.1, T4 0.69  Assessment and Plan:

## 2011-11-30 NOTE — Progress Notes (Signed)
Pt frequent vitals set for 15 mins x4 , 30 mins x2, and 2 hours x2. Pt. Remains stable, v/s not documented due to error in machine. Pt stable with no s/s of distress. Dion Saucier

## 2011-11-30 NOTE — Brief Op Note (Signed)
11/30/2011  2:41 PM  PATIENT:  Calvin Kemp  75 y.o. male  PRE-OPERATIVE DIAGNOSIS:  syncope  POST-OPERATIVE DIAGNOSIS:  * No post-op diagnosis entered *  PROCEDURE:  Procedure(s): PERMANENT PACEMAKER INSERTION  SURGEON:  Surgeon(s): Gardiner Rhyme, MD  PHYSICIAN ASSISTANT:   ASSISTANTS: none   ANESTHESIA:   none  EBL:   0cc  BLOOD ADMINISTERED:none  DRAINS: none   LOCAL MEDICATIONS USED:  LIDOCAINE 5CC  SPECIMEN:  No Specimen  DISPOSITION OF SPECIMEN:  N/A  COUNTS:  YES  TOURNIQUET:  * No tourniquets in log *  DICTATION: .Note written in EPIC  PLAN OF CARE: Admit for overnight observation  PATIENT DISPOSITION:  PACU - hemodynamically stable.   Delay start of Pharmacological VTE agent (>24hrs) due to surgical blood loss or risk of bleeding:  {YES/NO/NOT APPLICABLE:20182

## 2011-11-30 NOTE — Op Note (Signed)
SURGEON:  Hillis Range, MD     PREPROCEDURE DIAGNOSIS:  Symptomatic Mobitz II Second degree AV block    POSTPROCEDURE DIAGNOSIS:  Symptomatic Mobitz II Second degree AV block     PROCEDURES:   1. Left upper extremity venography.   2. Pacemaker implantation.     INTRODUCTION: Calvin Kemp is a 75 y.o. male  with a history of Mobitz II Second degree AV block and syncope who presents today for pacemaker implantation.  The patient reports intermittent episodes of dizziness over the past few months.  No reversible causes have been identified.  The patient therefore presents today for pacemaker implantation.     DESCRIPTION OF PROCEDURE:  Informed written consent was obtained, and the patient was brought to the electrophysiology lab in a fasting state.  The patient received IV fentanyl and versed sedation for the procedure today as outlined in the nursing report.  The patients left chest was prepped and draped in the usual sterile fashion by the EP lab staff. The skin overlying the left deltopectoral region was infiltrated with lidocaine for local analgesia.  A 4-cm incision was made over the left deltopectoral region.  A left subcutaneous pacemaker pocket was fashioned using a combination of sharp and blunt dissection. Electrocautery was required to assure hemostasis.    Left Upper Extremity Venography: A venogram of the left upper extremity was performed, which revealed a large left cephalic vein, which emptied into a large left subclavian vein.  The left axillary vein was moderate in size.    RA/RV Lead Placement: The left axillary vein was therefore cannulated with fluoroscopic visualization.  Through the left axillary vein, a [facility] model [phone] (serial number  [id number]) right atrial lead and a [employer] model 2088- 58 (serial number  [id number]) right ventricular lead were advanced with fluoroscopic visualization into the right atrial appendage and right ventricular apex  positions respectively.  Initial atrial lead P- waves measured 1.6 mV with impedance of 525 ohms and a threshold of 1.2 V at 0.5 msec.  Right ventricular lead R-waves measured 10 mV with an impedance of 563 ohms and a threshold of  0.5 V at 0.5 msec.  Both leads were secured to the pectoralis fascia using #2-0 silk over the suture sleeves.   Device Placement:  The leads were then connected to a [employer] Accent DR RF model [id number] (serial number [id number] ) pacemaker.  The pocket was irrigated with copious gentamicin solution.  The pacemaker was then placed into the pocket.  The pocket was then closed in 2 layers with 2.0 Vicryl suture for the subcutaneous and subcuticular layers.  Steri- Strips and a sterile dressing were then applied.  There were no early apparent complications.     CONCLUSIONS:   1. Successful implantation of a Industrial/product designer DR RF dual-chamber pacemaker for symptomatic Mobitz II Second degree AV block  2. No early apparent complications.           Hillis Range, MD 11/30/2011 2:41 PM

## 2011-12-01 ENCOUNTER — Ambulatory Visit (HOSPITAL_COMMUNITY): Payer: Medicare Other

## 2011-12-01 ENCOUNTER — Other Ambulatory Visit: Payer: Self-pay

## 2011-12-01 DIAGNOSIS — I441 Atrioventricular block, second degree: Secondary | ICD-10-CM

## 2011-12-01 NOTE — Progress Notes (Signed)
   ELECTROPHYSIOLOGY ROUNDING NOTE    Patient Name: Calvin Kemp Date of Encounter: 12-01-2011    SUBJECTIVE:Feels well status post pacemaker (STJ) implant.  No chest pain or shortness of breath.  Some tenderness at pacer site.  TELEMETRY: Reviewed telemetry pt AV pacing Filed Vitals:   11/30/11 1125 11/30/11 1600 11/30/11 1958 12/01/11 0554  BP: 158/61 128/72 169/75 172/79  Pulse: 41 61 74 63  Temp: 97.4 F (36.3 C)  98.3 F (36.8 C) 98 F (36.7 C)  TempSrc: Oral  Oral Oral  Resp: 18  18 18   Height: 6' (1.829 m)     Weight: 215 lb (97.523 kg)     SpO2: 94% 99% 96% 96%    Intake/Output Summary (Last 24 hours) at 12/01/11 0757 Last data filed at 11/30/11 1806  Gross per 24 hour  Intake     50 ml  Output      0 ml  Net     50 ml   GEN- The patient is well appearing, alert and oriented x 3 today.   Head- normocephalic, atraumatic Eyes-  Sclera clear, conjunctiva pink Ears- hearing intact Oropharynx- clear Neck- supple, no JVP Lymph- no cervical lymphadenopathy Lungs- Clear to ausculation bilaterally, normal work of breathing Chest- pacemaker pocket looks good Heart- Regular rate and rhythm, no murmurs, rubs or gallops, PMI not laterally displaced GI- soft, NT, ND, + BS Extremities- no clubbing, cyanosis, or edema   Pacemaker interrogation- reviewed in detail today    LABS: Basic Metabolic Panel:  [facility] 11/30/11 1207  NA 141  K 4.9  CL 109  CO2 22  GLUCOSE 218*  BUN 15  CREATININE 0.99  CALCIUM 9.4  MG --  PHOS --   CBC:  Basename 11/30/11 1207  WBC 10.2  NEUTROABS --  HGB 13.7  HCT 40.9  MCV 91.3  PLT 157    Radiology/Studies:  Dg Chest 2 View 11/30/2011  *RADIOLOGY REPORT*  Clinical Data: Precardiac procedure respiratory exam; cough and syncope  CHEST - 2 VIEW  Comparison: None.  Findings: There is mild cardiomegaly.  The mediastinum and pulmonary vasculature are within normal limits.  Both lungs are clear except for mild chronic  interstitial changes at the left base.  No evidence of acute osseous abnormality.  IMPRESSION: Mild cardiomegaly.  Chronic interstitial changes at the left lung base.  Original Report Authenticated By: Brandon Melnick, M.D.    Active Problems:  Mobitz (type) II atrioventricular block   Wound care, arm mobility, restrictions reviewed with patient.  He is traveling to [facility] next Friday.  We will do wound check next Thursday before he goes out of town.  He is aware of need to take temporary pacemaker ID card with him to airport.  Advised he should not lift his arms for full body scanner (would not interfere with device, but pt should not lift arms over head).  Advised patient to call the office for any swelling, bleeding, drainage, fevers.  He verbalized understanding of instructions.  BP to be managed by Dr Anne Fu as an outpatient Routine device care.  DC to home today  Hillis Range, MD 8:43 AM 12/01/2011

## 2011-12-01 NOTE — Plan of Care (Signed)
Problem: Phase III Progression Outcomes Goal: Discharge plan remains appropriate-arrangements made Outcome: Completed/Met Date Met:  12/01/11 discharge met

## 2011-12-01 NOTE — Discharge Summary (Signed)
ELECTROPHYSIOLOGY PROCEDURE DISCHARGE SUMMARY    Patient ID: Calvin Kemp,  MRN: [id number], DOB/AGE: 73-Dec-1937 73 y.o.  Admit date: 11/30/2011 Discharge date: 12/01/2011  Primary Care Physician: Daisy Floro, MD Primary Cardiologist: Donato Schultz, MD Electrophysiologist: Hillis Range, MD  Primary Discharge Diagnosis:  Mobitz II AV block status post pacemaker implant this admission  Secondary Discharge Diagnosis:  1.  Diabetes Type II 2.  Obesity 3.  Hypertension 4.  Hyperlipidemia 5.  Nephrolithiasis 6.  Charcot Marie tooth muscular atrophy 7.  Status post should arthroscopy  Procedures This Admission:  1.  Implantation of a dual chamber pacemaker on 11-30-11 by Dr Johney Frame.  The patient received a [employer] model number [id number] pacemaker with model number 2088 right atrial and right ventricular leads.  There were no early apparent complications. 2.  CXR on 12-01-11 which demonstrated no pneumothorax status post device implantation.   Brief HPI:  Calvin Kemp is a 75 year old male with a history of diabetes and hypertension who began having episodes of presyncope 1-2 times per week.  His episodes typically only lasts several seconds.  He was evaluated by Dr Anne Fu and had an event monitor placed.  This demonstrated episodes of Mobitz II second degree AV block with occasional 2:1 AV block.  He was encouraged to be evaluated for pacemaker implantation but declined.  He then had an episode of syncope which prompted referral to Dr Johney Frame in the outpatient setting for pacemaker implantation.  Dr Johney Frame examined the patient and felt that pacemaker implantation was reasonable.  Risks, benefits, and alternatives to the procedure were discussed with the patient and he wished to proceed.  Hospital Course: The patient was admitted on 12-01-11 for planned implantation of a dual chamber pacemaker.  This was carried out by Dr Johney Frame with details as outlined above.  He was monitored on  telemetry overnight which demonstrated AV pacing.  His device was interrogated and found to be functioning normally. CXR was obtained which demonstrated no pneumothorax status post device implantation.  His left chest was without hematoma or ecchymosis.  Dr Johney Frame examined the patient on 12-01-11 and considered him stable for discharge.   Discharge Vitals: Blood pressure 172/79, pulse 63, temperature 98 F (36.7 C), temperature source Oral, resp. rate 18, height 6' (1.829 m), weight 215 lb (97.523 kg), SpO2 96.00%.    Labs:   Lab Results  Component Value Date   WBC 10.2 11/30/2011   HGB 13.7 11/30/2011   HCT 40.9 11/30/2011   MCV 91.3 11/30/2011   PLT 157 11/30/2011    Lab 11/30/11 1207  NA 141  K 4.9  CL 109  CO2 22  BUN 15  CREATININE 0.99  CALCIUM 9.4  PROT --  BILITOT --  ALKPHOS --  ALT --  AST --  GLUCOSE 218*    Discharge Medications: Current Discharge Medication List    CONTINUE these medications which have NOT CHANGED   Details  aspirin 81 MG tablet Take 81 mg by mouth daily.      atorvastatin (LIPITOR) 20 MG tablet Take 20 mg by mouth daily.      BALANCED B COMPLEX CR PO Take 1 capsule by mouth daily.     Calcium-Magnesium (CAL-MAG) 500-250 MG TABS Take 1 tablet by mouth daily.      CHONDROITIN SULFATE PO Take 1 tablet by mouth daily.     CHROMIUM ASPARTATE PO Take 1 tablet by mouth daily.     co-enzyme Q-10 30  MG capsule Take 50 mg by mouth 3 (three) times daily.      colesevelam (WELCHOL) 625 MG tablet Take 625 mg by mouth daily.      folic acid (FOLVITE) 1 MG tablet Take 1 mg by mouth daily.      Glucosamine HCl-Glucosamin SO4 (GLUCOSAMINE COMPLEX PO) Take 1 capsule by mouth 3 (three) times daily.      levothyroxine (SYNTHROID, LEVOTHROID) 50 MCG tablet Take 50 mcg by mouth daily.      linagliptin (TRADJENTA) 5 MG TABS tablet Take 5 mg by mouth daily.      lisinopril (PRINIVIL,ZESTRIL) 20 MG tablet Take 20 mg by mouth daily.      metFORMIN  (GLUCOPHAGE) 500 MG tablet Take 500 mg by mouth 2 (two) times daily with a meal.      Multiple Vitamin (MULTIVITAMIN) capsule Take 1 capsule by mouth daily.      Omega-3 Fatty Acids (EPA PO) Take 1 capsule by mouth daily.      repaglinide (PRANDIN) 0.5 MG tablet Take 0.5 mg by mouth 3 (three) times daily before meals.          Disposition:  Discharge Orders    Future Appointments: Provider: Department: Dept Phone: Center:   12/07/2011 12:30 PM Vella Kohler [facility] [phone] [facility]     Follow-up Information    Follow up with [facility] on 12/07/2011. (Device Clinic - 12:30pm)    Contact information:   [address] [place] [address]       Follow up with Hillis Range, MD in 3 months. (office will call)    Contact information:   [address] [place] [address] [phone]          Duration of Discharge Encounter: Greater than 30 minutes including physician time.  Signed, Gypsy Balsam, RN, BSN 12/01/2011, 9:27 AM   I have seen, examined the patient, and reviewed the above discharge assessment and plan.   Co Sign: Hillis Range, MD 12/01/2011 6:38 PM

## 2011-12-07 ENCOUNTER — Ambulatory Visit (INDEPENDENT_AMBULATORY_CARE_PROVIDER_SITE_OTHER): Payer: Medicare Other | Admitting: *Deleted

## 2011-12-07 ENCOUNTER — Encounter: Payer: Self-pay | Admitting: Internal Medicine

## 2011-12-07 DIAGNOSIS — I441 Atrioventricular block, second degree: Secondary | ICD-10-CM

## 2011-12-07 NOTE — Progress Notes (Signed)
Wound check-PPM 

## 2011-12-28 DIAGNOSIS — E669 Obesity, unspecified: Secondary | ICD-10-CM | POA: Diagnosis not present

## 2011-12-28 DIAGNOSIS — Z833 Family history of diabetes mellitus: Secondary | ICD-10-CM | POA: Diagnosis not present

## 2012-01-02 DIAGNOSIS — I10 Essential (primary) hypertension: Secondary | ICD-10-CM | POA: Diagnosis not present

## 2012-01-02 DIAGNOSIS — I441 Atrioventricular block, second degree: Secondary | ICD-10-CM | POA: Diagnosis not present

## 2012-01-02 DIAGNOSIS — E7800 Pure hypercholesterolemia, unspecified: Secondary | ICD-10-CM | POA: Diagnosis not present

## 2012-01-02 DIAGNOSIS — Z950 Presence of cardiac pacemaker: Secondary | ICD-10-CM | POA: Diagnosis not present

## 2012-04-03 DIAGNOSIS — E669 Obesity, unspecified: Secondary | ICD-10-CM | POA: Diagnosis not present

## 2012-04-03 DIAGNOSIS — Z833 Family history of diabetes mellitus: Secondary | ICD-10-CM | POA: Diagnosis not present

## 2012-04-03 DIAGNOSIS — E1142 Type 2 diabetes mellitus with diabetic polyneuropathy: Secondary | ICD-10-CM | POA: Diagnosis not present

## 2012-04-03 DIAGNOSIS — E1149 Type 2 diabetes mellitus with other diabetic neurological complication: Secondary | ICD-10-CM | POA: Diagnosis not present

## 2012-04-03 DIAGNOSIS — E039 Hypothyroidism, unspecified: Secondary | ICD-10-CM | POA: Diagnosis not present

## 2012-05-15 DIAGNOSIS — E1139 Type 2 diabetes mellitus with other diabetic ophthalmic complication: Secondary | ICD-10-CM | POA: Diagnosis not present

## 2012-05-17 ENCOUNTER — Telehealth: Payer: Self-pay | Admitting: Internal Medicine

## 2012-05-17 NOTE — Telephone Encounter (Signed)
05-17-12 called pt, states he is having his device checked by dr skains/mt

## 2012-06-06 ENCOUNTER — Telehealth: Payer: Self-pay | Admitting: Internal Medicine

## 2012-06-06 NOTE — Telephone Encounter (Signed)
06-06-12 LMM @ 359PM FOR PT TO LET us KNOW IF DR SKAINS IS CHECKING HIS PACERMAKER, IF NOT NEEDS PACER CHECK HERE/MT

## 2012-06-07 LAB — PACEMAKER DEVICE OBSERVATION
AL AMPLITUDE: 2.4 mv
AL IMPEDENCE PM: 400 Ohm
BATTERY VOLTAGE: 3.1885 V
RV LEAD IMPEDENCE PM: 587.5 Ohm
VENTRICULAR PACING PM: 100

## 2012-06-07 NOTE — Telephone Encounter (Signed)
Will forward to device clinic  

## 2012-06-07 NOTE — Telephone Encounter (Signed)
FYI: Patient called to say he is F/U w/Dr. Lucia Gaskins and will continue this treatment path. Patient is keeping up with his appnts and will see Dr. Lucia Gaskins again next month.

## 2012-06-07 NOTE — Telephone Encounter (Signed)
Marked pt inactive in Paceart and left message with Orpha Bur double checking that Calvin Kemp is following pacer.

## 2012-07-01 DIAGNOSIS — T7840XA Allergy, unspecified, initial encounter: Secondary | ICD-10-CM | POA: Diagnosis not present

## 2012-07-01 DIAGNOSIS — L02519 Cutaneous abscess of unspecified hand: Secondary | ICD-10-CM | POA: Diagnosis not present

## 2012-07-03 DIAGNOSIS — I441 Atrioventricular block, second degree: Secondary | ICD-10-CM | POA: Diagnosis not present

## 2012-07-03 DIAGNOSIS — I10 Essential (primary) hypertension: Secondary | ICD-10-CM | POA: Diagnosis not present

## 2012-07-03 DIAGNOSIS — Z950 Presence of cardiac pacemaker: Secondary | ICD-10-CM | POA: Diagnosis not present

## 2012-07-03 DIAGNOSIS — E7800 Pure hypercholesterolemia, unspecified: Secondary | ICD-10-CM | POA: Diagnosis not present

## 2012-07-08 DIAGNOSIS — L821 Other seborrheic keratosis: Secondary | ICD-10-CM | POA: Diagnosis not present

## 2012-07-08 DIAGNOSIS — L259 Unspecified contact dermatitis, unspecified cause: Secondary | ICD-10-CM | POA: Diagnosis not present

## 2012-07-09 DIAGNOSIS — E1142 Type 2 diabetes mellitus with diabetic polyneuropathy: Secondary | ICD-10-CM | POA: Diagnosis not present

## 2012-07-09 DIAGNOSIS — Z833 Family history of diabetes mellitus: Secondary | ICD-10-CM | POA: Diagnosis not present

## 2012-07-09 DIAGNOSIS — E1149 Type 2 diabetes mellitus with other diabetic neurological complication: Secondary | ICD-10-CM | POA: Diagnosis not present

## 2012-07-09 DIAGNOSIS — E663 Overweight: Secondary | ICD-10-CM | POA: Diagnosis not present

## 2012-07-19 DIAGNOSIS — Z950 Presence of cardiac pacemaker: Secondary | ICD-10-CM | POA: Diagnosis not present

## 2012-08-01 DIAGNOSIS — I10 Essential (primary) hypertension: Secondary | ICD-10-CM | POA: Diagnosis not present

## 2012-08-01 DIAGNOSIS — E7800 Pure hypercholesterolemia, unspecified: Secondary | ICD-10-CM | POA: Diagnosis not present

## 2012-08-01 DIAGNOSIS — I4891 Unspecified atrial fibrillation: Secondary | ICD-10-CM | POA: Diagnosis not present

## 2012-08-01 DIAGNOSIS — Z950 Presence of cardiac pacemaker: Secondary | ICD-10-CM | POA: Diagnosis not present

## 2012-09-06 DIAGNOSIS — R197 Diarrhea, unspecified: Secondary | ICD-10-CM | POA: Diagnosis not present

## 2012-10-11 DIAGNOSIS — E1149 Type 2 diabetes mellitus with other diabetic neurological complication: Secondary | ICD-10-CM | POA: Diagnosis not present

## 2012-10-11 DIAGNOSIS — Z7901 Long term (current) use of anticoagulants: Secondary | ICD-10-CM | POA: Diagnosis not present

## 2012-10-11 DIAGNOSIS — Z950 Presence of cardiac pacemaker: Secondary | ICD-10-CM | POA: Diagnosis not present

## 2012-10-11 DIAGNOSIS — I4891 Unspecified atrial fibrillation: Secondary | ICD-10-CM | POA: Diagnosis not present

## 2012-10-11 DIAGNOSIS — E7800 Pure hypercholesterolemia, unspecified: Secondary | ICD-10-CM | POA: Diagnosis not present

## 2012-10-11 DIAGNOSIS — E039 Hypothyroidism, unspecified: Secondary | ICD-10-CM | POA: Diagnosis not present

## 2012-10-18 DIAGNOSIS — E1142 Type 2 diabetes mellitus with diabetic polyneuropathy: Secondary | ICD-10-CM | POA: Diagnosis not present

## 2012-10-18 DIAGNOSIS — E1149 Type 2 diabetes mellitus with other diabetic neurological complication: Secondary | ICD-10-CM | POA: Diagnosis not present

## 2012-10-18 DIAGNOSIS — E663 Overweight: Secondary | ICD-10-CM | POA: Diagnosis not present

## 2012-10-18 DIAGNOSIS — Z833 Family history of diabetes mellitus: Secondary | ICD-10-CM | POA: Diagnosis not present

## 2013-01-24 DIAGNOSIS — Z833 Family history of diabetes mellitus: Secondary | ICD-10-CM | POA: Diagnosis not present

## 2013-01-24 DIAGNOSIS — E663 Overweight: Secondary | ICD-10-CM | POA: Diagnosis not present

## 2013-01-24 DIAGNOSIS — E1149 Type 2 diabetes mellitus with other diabetic neurological complication: Secondary | ICD-10-CM | POA: Diagnosis not present

## 2013-01-24 DIAGNOSIS — E1142 Type 2 diabetes mellitus with diabetic polyneuropathy: Secondary | ICD-10-CM | POA: Diagnosis not present

## 2013-02-12 DIAGNOSIS — E039 Hypothyroidism, unspecified: Secondary | ICD-10-CM | POA: Diagnosis not present

## 2013-02-12 DIAGNOSIS — E1149 Type 2 diabetes mellitus with other diabetic neurological complication: Secondary | ICD-10-CM | POA: Diagnosis not present

## 2013-02-14 DIAGNOSIS — E119 Type 2 diabetes mellitus without complications: Secondary | ICD-10-CM | POA: Diagnosis not present

## 2013-03-10 ENCOUNTER — Encounter: Payer: Self-pay | Admitting: Internal Medicine

## 2013-03-19 DIAGNOSIS — Z950 Presence of cardiac pacemaker: Secondary | ICD-10-CM | POA: Diagnosis not present

## 2013-03-19 DIAGNOSIS — I4430 Unspecified atrioventricular block: Secondary | ICD-10-CM | POA: Diagnosis not present

## 2013-03-19 DIAGNOSIS — R55 Syncope and collapse: Secondary | ICD-10-CM | POA: Diagnosis not present

## 2013-03-26 DIAGNOSIS — E1142 Type 2 diabetes mellitus with diabetic polyneuropathy: Secondary | ICD-10-CM | POA: Diagnosis not present

## 2013-03-26 DIAGNOSIS — E663 Overweight: Secondary | ICD-10-CM | POA: Diagnosis not present

## 2013-03-26 DIAGNOSIS — E1149 Type 2 diabetes mellitus with other diabetic neurological complication: Secondary | ICD-10-CM | POA: Diagnosis not present

## 2013-03-26 DIAGNOSIS — Z833 Family history of diabetes mellitus: Secondary | ICD-10-CM | POA: Diagnosis not present

## 2013-04-11 DIAGNOSIS — B351 Tinea unguium: Secondary | ICD-10-CM | POA: Diagnosis not present

## 2013-04-30 DIAGNOSIS — Z950 Presence of cardiac pacemaker: Secondary | ICD-10-CM | POA: Diagnosis not present

## 2013-04-30 DIAGNOSIS — I4891 Unspecified atrial fibrillation: Secondary | ICD-10-CM | POA: Diagnosis not present

## 2013-04-30 DIAGNOSIS — I10 Essential (primary) hypertension: Secondary | ICD-10-CM | POA: Diagnosis not present

## 2013-04-30 DIAGNOSIS — E7800 Pure hypercholesterolemia, unspecified: Secondary | ICD-10-CM | POA: Diagnosis not present

## 2013-05-03 IMAGING — CR DG CHEST 2V
2 series · 2 of 2 positions shown · non-contrast
Comparison: None.

CLINICAL DATA: Precardiac procedure respiratory exam; cough and
syncope

CHEST - 2 VIEW

[view not recorded (1 of 2)]
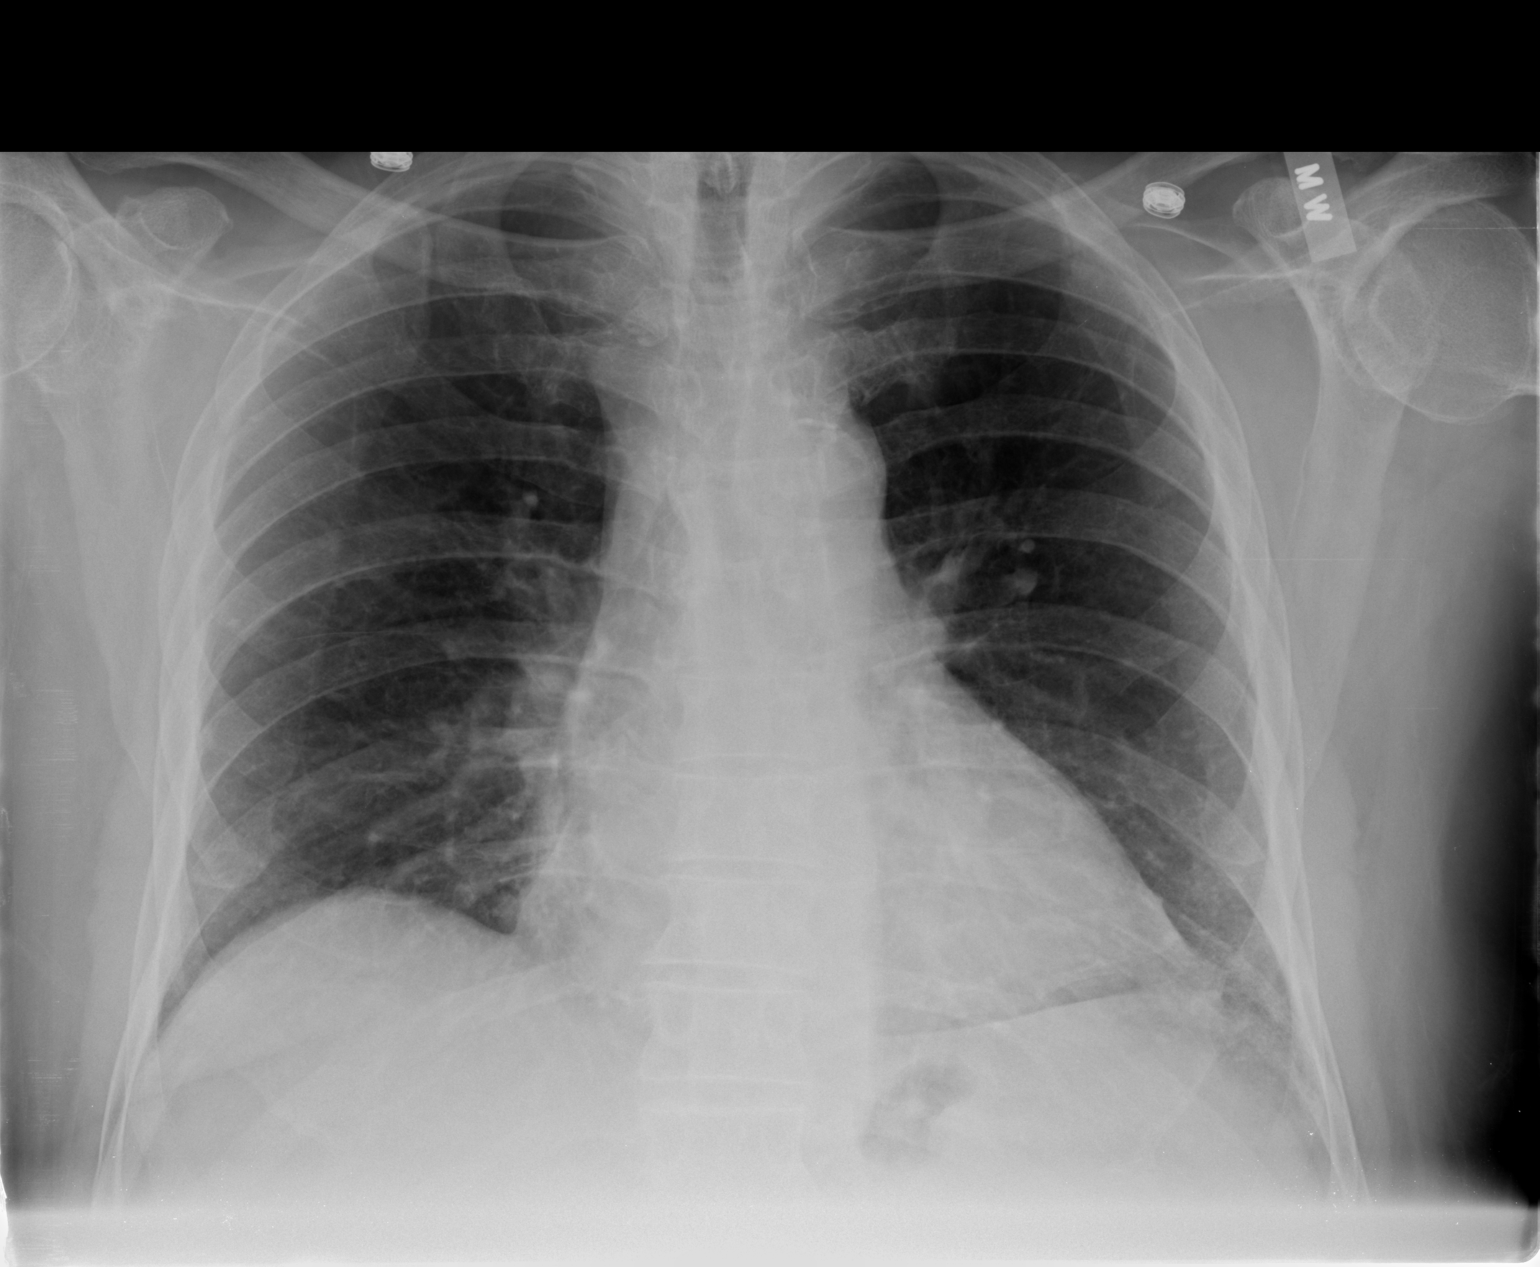

[view not recorded (2 of 2)]
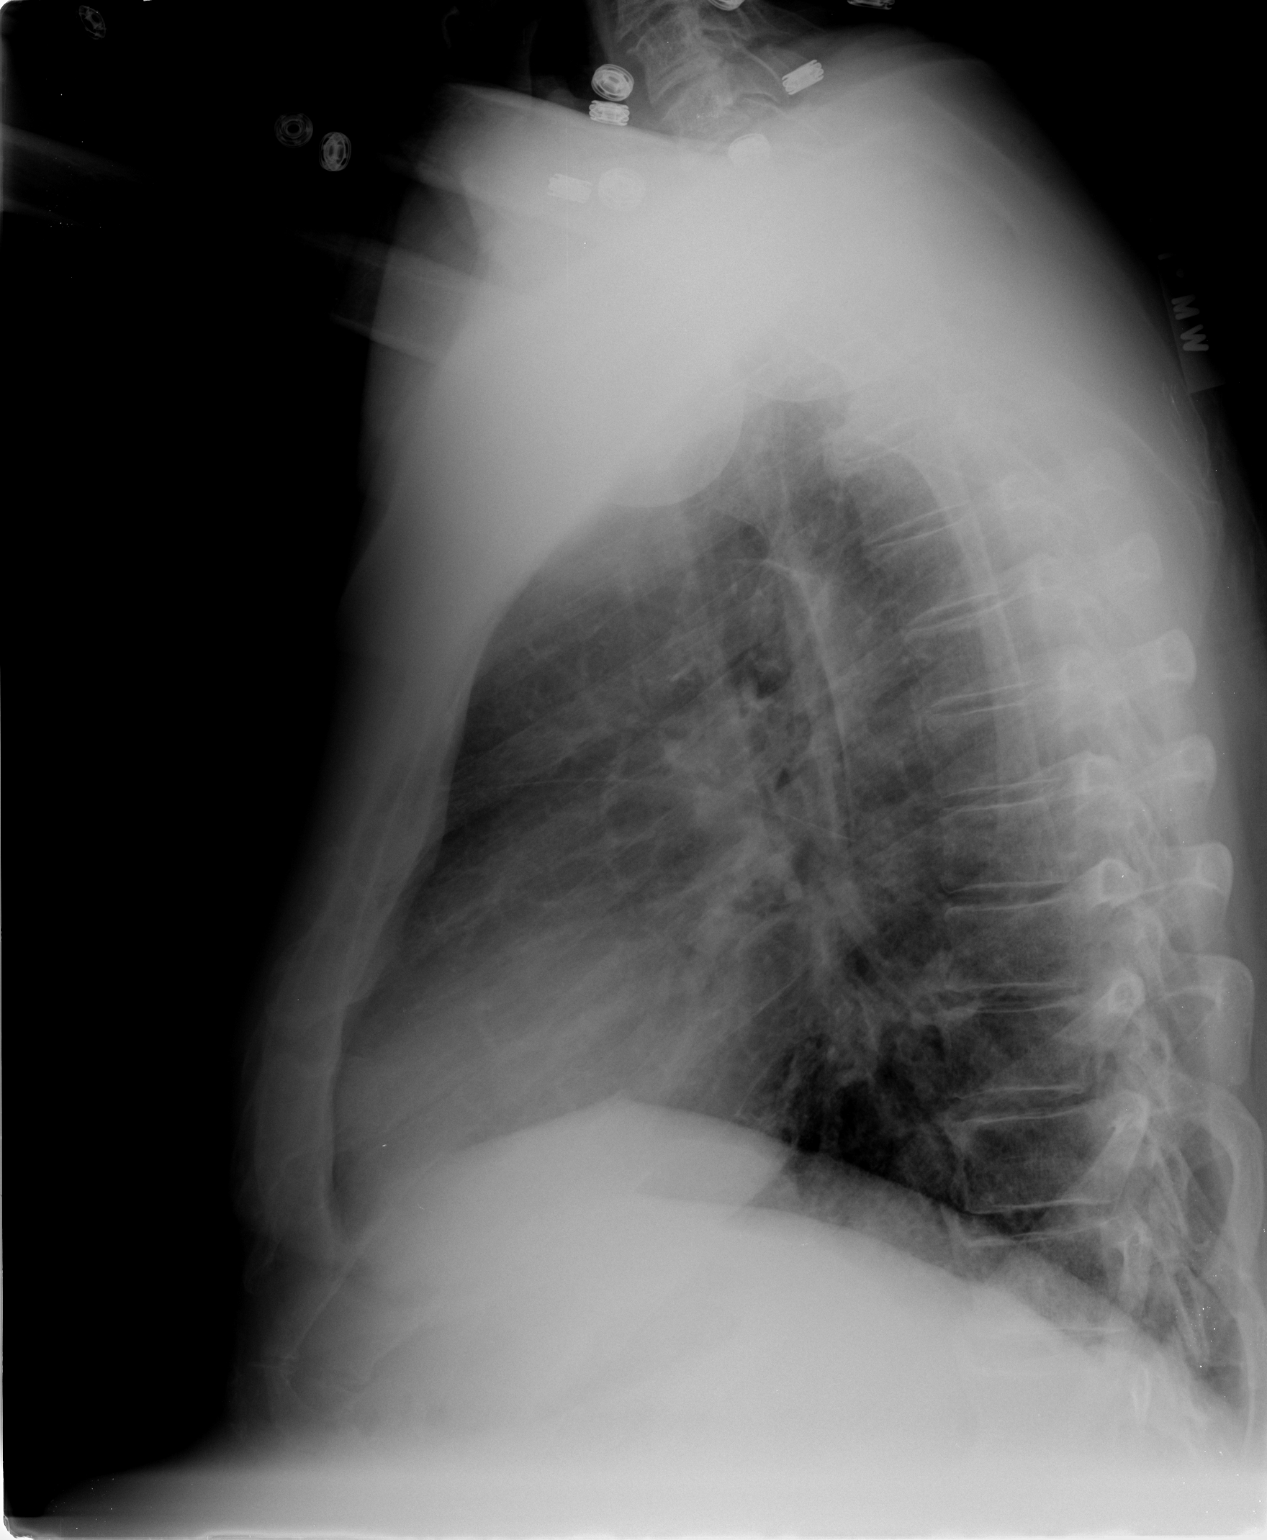

[2 of 2 positions shown; findings below may reference images not displayed]

FINDINGS: There is mild cardiomegaly.  The mediastinum and
pulmonary vasculature are within normal limits.  Both lungs are
clear except for mild chronic interstitial changes at the left
base.  No evidence of acute osseous abnormality.
IMPRESSION: Mild cardiomegaly.

Chronic interstitial changes at the left lung base.

## 2013-05-04 IMAGING — CR DG CHEST 2V
2 series · 2 of 2 positions shown · non-contrast
Comparison: 11/30/2011.

CLINICAL DATA: History of post pacer placement.

CHEST - 2 VIEW

[w chest pa]
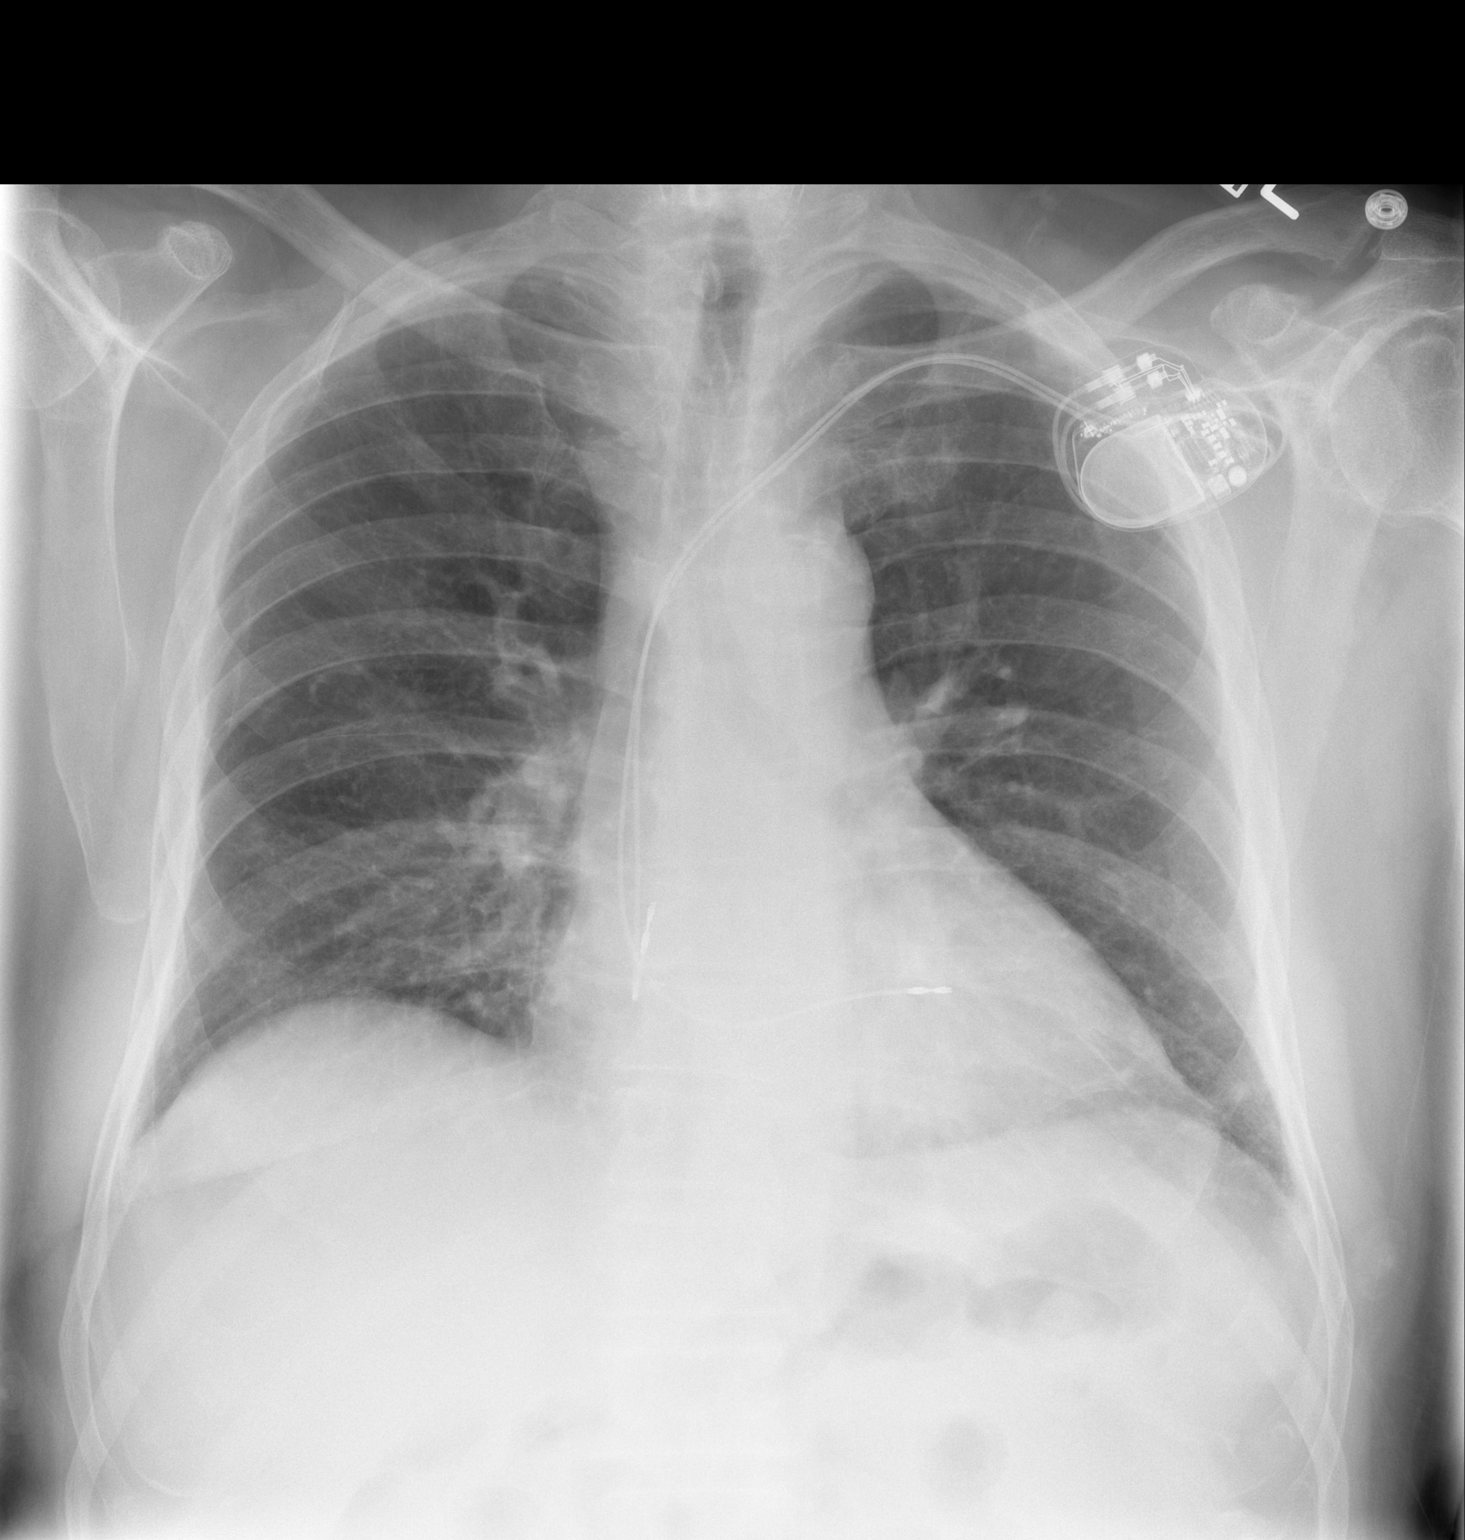

[w chest lat]
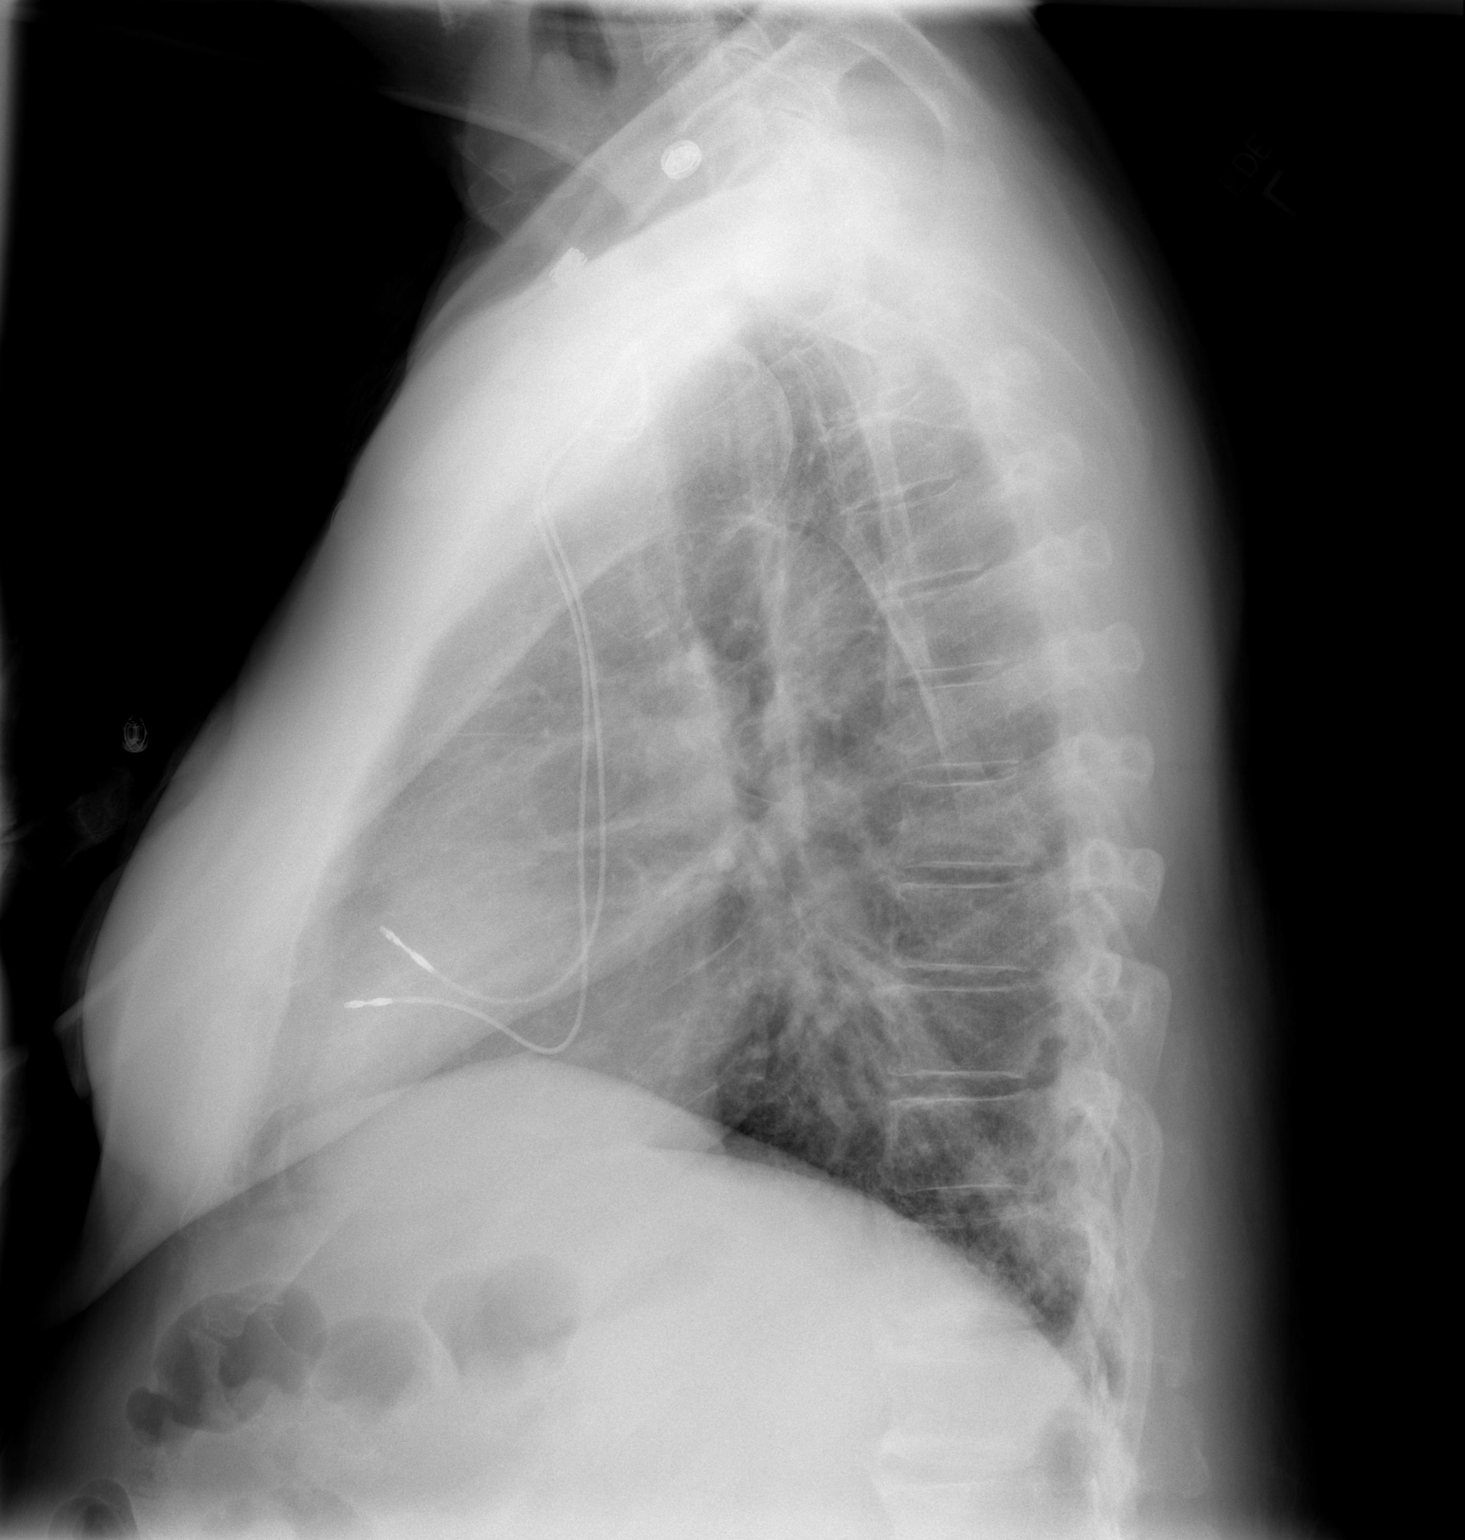

[2 of 2 positions shown; findings below may reference images not displayed]

FINDINGS: There is interval placement of dual lead transvenous
pacemaker with controller device on the left.  No pneumothorax or
pleural effusion is evident.  Cardiac silhouette is borderline
enlarged and appears stable.  No pulmonary edema or consolidation
is evident.  There is minimal basilar atelectasis. Bones appear
average for age.
IMPRESSION: Transvenous pacemaker in place.  No pneumothorax evident.
Borderline cardiac enlargement.  No pulmonary edema or
consolidation.  Minimal basilar atelectasis.

## 2013-06-18 DIAGNOSIS — I4430 Unspecified atrioventricular block: Secondary | ICD-10-CM | POA: Diagnosis not present

## 2013-06-18 DIAGNOSIS — Z950 Presence of cardiac pacemaker: Secondary | ICD-10-CM | POA: Diagnosis not present

## 2013-07-01 DIAGNOSIS — E1149 Type 2 diabetes mellitus with other diabetic neurological complication: Secondary | ICD-10-CM | POA: Diagnosis not present

## 2013-07-01 DIAGNOSIS — Z833 Family history of diabetes mellitus: Secondary | ICD-10-CM | POA: Diagnosis not present

## 2013-07-01 DIAGNOSIS — E663 Overweight: Secondary | ICD-10-CM | POA: Diagnosis not present

## 2013-07-01 DIAGNOSIS — E1142 Type 2 diabetes mellitus with diabetic polyneuropathy: Secondary | ICD-10-CM | POA: Diagnosis not present

## 2013-07-15 DIAGNOSIS — G600 Hereditary motor and sensory neuropathy: Secondary | ICD-10-CM | POA: Diagnosis not present

## 2013-09-19 ENCOUNTER — Ambulatory Visit (INDEPENDENT_AMBULATORY_CARE_PROVIDER_SITE_OTHER): Payer: Medicare Other | Admitting: *Deleted

## 2013-09-19 DIAGNOSIS — I441 Atrioventricular block, second degree: Secondary | ICD-10-CM | POA: Diagnosis not present

## 2013-09-19 LAB — PACEMAKER DEVICE OBSERVATION
BAMS-0001: 150 {beats}/min
BAMS-0003: 70 {beats}/min
BATTERY VOLTAGE: 2.9478 V
DEVICE MODEL PM: 7309337
VENTRICULAR PACING PM: 99

## 2013-09-19 NOTE — Progress Notes (Signed)
Pacemaker check in clinic. Normal device function. Thresholds, sensing, impedances consistent with previous measurements. Device programmed to maximize longevity. 63 mode switches--longest was 1 hour 2 minutes. + ASA only. Pt was taking Xarelto but had to stop due to bleeding gums. No high ventricular rates noted. Device programmed at appropriate safety margins. Histogram distribution appropriate for patient activity level. Device programmed to optimize intrinsic conduction. Estimated longevity 7.3 to 7.9 years. Patient enrolled in remote follow-up. ROV in 3 mths w/JA.

## 2013-10-08 DIAGNOSIS — E1149 Type 2 diabetes mellitus with other diabetic neurological complication: Secondary | ICD-10-CM | POA: Diagnosis not present

## 2013-10-09 ENCOUNTER — Encounter: Payer: Self-pay | Admitting: Internal Medicine

## 2013-10-28 ENCOUNTER — Telehealth: Payer: Self-pay | Admitting: *Deleted

## 2013-10-28 NOTE — Telephone Encounter (Signed)
Pt request refill of Lamisil.  Pt's Misys record shows that he received 90 on 04/11/2013.  I explained to pt that 90 was the therapeutic dose of Lamisil.  Pt states understanding.

## 2013-11-12 ENCOUNTER — Ambulatory Visit (INDEPENDENT_AMBULATORY_CARE_PROVIDER_SITE_OTHER): Payer: Medicare Other | Admitting: Cardiology

## 2013-11-12 ENCOUNTER — Encounter: Payer: Self-pay | Admitting: Cardiology

## 2013-11-12 VITALS — BP 148/75 | Ht 72.0 in | Wt 208.0 lb

## 2013-11-12 DIAGNOSIS — Z950 Presence of cardiac pacemaker: Secondary | ICD-10-CM

## 2013-11-12 DIAGNOSIS — I441 Atrioventricular block, second degree: Secondary | ICD-10-CM

## 2013-11-12 DIAGNOSIS — E785 Hyperlipidemia, unspecified: Secondary | ICD-10-CM | POA: Diagnosis not present

## 2013-11-12 DIAGNOSIS — I10 Essential (primary) hypertension: Secondary | ICD-10-CM | POA: Diagnosis not present

## 2013-11-12 HISTORY — DX: Hyperlipidemia, unspecified: E78.5

## 2013-11-12 HISTORY — DX: Presence of cardiac pacemaker: Z95.0

## 2013-11-12 NOTE — Progress Notes (Signed)
[address] [place], [place]  [address] Phone: [phone] Fax:  [phone]  Date:  11/12/2013   ID:  Calvin Kemp, DOB January 23, 1936, MRN [id number]  PCP:   Duane Lope, MD   History of Present Illness: Calvin Kemp is a 77 y.o. male with hypertension, hyperlipidemia, diabetes with peripheral neuropathy, who underwent pacemaker implantation after discovery of 2:1 AV block here for followup.  During pacemaker evaluation, discovered occasional bursts of atrial fibrillation/mode switching.   I gave him prescription for Xarelto 20 mg. He did not take it for very long because of gum bleeding. We discussed the implications of this, increased stroke rate. He is taking aspirin. Previous creatinine was in the 1.2 1.3 range.  He is asymptomatic, no prior strokes, no prior heart failure. He does have diabetes, age, hypertension. he has been traveling to [place], and is hopeful for [employer] (Prof[facility]  We had lengthy discussion about the invocation is of not taking anticoagulation including stroke. I strongly encouraged him to see a dentist or to discuss further his gum bleeding episodes. I strongly encouraged him to resume his anticoagulation. He understands the risk of stroke.    Wt Readings from Last 3 Encounters:  11/12/13 208 lb (94.348 kg)  11/30/11 215 lb (97.523 kg)  11/30/11 215 lb (97.523 kg)     Past Medical History  Diagnosis Date  . DM2 (diabetes mellitus, type 2)   . Obesity   . HTN (hypertension)   . HLD (hyperlipidemia)   . Nephrolithiasis   . Charcot Marie Tooth muscular atrophy     wears braces on his legs  . Second degree Mobitz II AV block     Past Surgical History  Procedure Laterality Date  . Kidney stone surgery    . Shoulder arthroscopy      Current Outpatient Prescriptions  Medication Sig Dispense Refill  . aspirin 81 MG tablet Take 81 mg by mouth daily.        Marland Kitchen atorvastatin (LIPITOR) 20 MG tablet Take 20 mg by mouth daily.          . Chromium Picolinate 1000 MCG TABS Take by mouth daily.      . INVOKANA 300 MG TABS daily.       Marland Kitchen linagliptin (TRADJENTA) 5 MG TABS tablet Take 5 mg by mouth daily.        Marland Kitchen lisinopril (PRINIVIL,ZESTRIL) 20 MG tablet Take 20 mg by mouth daily.        . Multiple Vitamin (MULTIVITAMIN) capsule Take 1 capsule by mouth daily.        . repaglinide (PRANDIN) 0.5 MG tablet Take 0.5 mg by mouth daily.        No current facility-administered medications for this visit.    Allergies:   No Known Allergies  Social History:  The patient  reports that he has never smoked. He does not have any smokeless tobacco history on file. He reports that he drinks alcohol. He reports that he does not use illicit drugs.   ROS:  Please see the history of present illness.   No syncope, no bleeding, no chest pain    PHYSICAL EXAM: VS:  BP 148/75  Ht 6' (1.829 m)  Wt 208 lb (94.348 kg)  BMI 28.20 kg/m2 Well nourished, well developed, in no acute distress HEENT: normal Neck: no JVD Cardiac:  normal S1, S2; RRR; no murmur Lungs:  clear to auscultation bilaterally, no wheezing, rhonchi or rales  Abd: soft, nontender, no hepatomegaly Ext: no edema Skin: warm and dry Neuro: no focal abnormalities noted  EKG: none today.     ASSESSMENT AND PLAN:  1. PAF - Brief episode of atrial fibrillation seen on monitor/pacemaker. See discussion above. Dr. Johney Frame will be interrogating his pacemaker. 2. Hyperlipidemia-triglycerides remain elevated in the 270 range which have been lifelong for him. He is on statin therapy. His LDL was 73. Continue with low fat diet. 3. Hypertension-currently reasonably controlled. Continue to monitor. Continue with ACE inhibitor.  Signed, Donato Schultz, MD [facility]  11/12/2013 2:10 PM

## 2013-11-12 NOTE — Patient Instructions (Signed)
Your physician wants you to follow-up in: ONE YEAR WITH DR SKAINS You will receive a reminder letter in the mail two months in advance. If you don't receive a letter, please call our office to schedule the follow-up appointment.  

## 2013-12-02 DIAGNOSIS — E1149 Type 2 diabetes mellitus with other diabetic neurological complication: Secondary | ICD-10-CM | POA: Diagnosis not present

## 2013-12-02 DIAGNOSIS — E663 Overweight: Secondary | ICD-10-CM | POA: Diagnosis not present

## 2013-12-02 DIAGNOSIS — E1142 Type 2 diabetes mellitus with diabetic polyneuropathy: Secondary | ICD-10-CM | POA: Diagnosis not present

## 2013-12-02 DIAGNOSIS — Z833 Family history of diabetes mellitus: Secondary | ICD-10-CM | POA: Diagnosis not present

## 2013-12-19 ENCOUNTER — Ambulatory Visit: Payer: Self-pay | Admitting: Podiatrist

## 2014-01-01 ENCOUNTER — Ambulatory Visit (INDEPENDENT_AMBULATORY_CARE_PROVIDER_SITE_OTHER): Payer: Medicare Other | Admitting: Internal Medicine

## 2014-01-01 ENCOUNTER — Encounter: Payer: Self-pay | Admitting: Internal Medicine

## 2014-01-01 VITALS — BP 134/76 | HR 72 | Ht 71.0 in | Wt 204.0 lb

## 2014-01-01 DIAGNOSIS — I441 Atrioventricular block, second degree: Secondary | ICD-10-CM

## 2014-01-01 DIAGNOSIS — I10 Essential (primary) hypertension: Secondary | ICD-10-CM

## 2014-01-01 LAB — MDC_IDC_ENUM_SESS_TYPE_INCLINIC
Battery Remaining Longevity: 105.6 mo
Battery Voltage: 2.96 V
Brady Statistic RV Percent Paced: 99.98 %
Implantable Pulse Generator Model: 2210
Lead Channel Impedance Value: 412.5 Ohm
Lead Channel Pacing Threshold Amplitude: 0.625 V
Lead Channel Pacing Threshold Amplitude: 0.75 V
Lead Channel Sensing Intrinsic Amplitude: 2.8 mV
Lead Channel Setting Pacing Pulse Width: 0.5 ms
MDC IDC MSMT LEADCHNL RA PACING THRESHOLD PULSEWIDTH: 0.5 ms
MDC IDC MSMT LEADCHNL RV IMPEDANCE VALUE: 462.5 Ohm
MDC IDC MSMT LEADCHNL RV PACING THRESHOLD PULSEWIDTH: 0.5 ms
MDC IDC PG SERIAL: 7309337
MDC IDC SESS DTM: 20150115121217
MDC IDC SET LEADCHNL RA PACING AMPLITUDE: 2 V
MDC IDC SET LEADCHNL RV PACING AMPLITUDE: 0.875
MDC IDC SET LEADCHNL RV SENSING SENSITIVITY: 2.5 mV
MDC IDC STAT BRADY RA PERCENT PACED: 6.7 %

## 2014-01-01 NOTE — Progress Notes (Signed)
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 male who presents today for routine electrophysiology followup.  Since last being seen in our clinic in 2012, the patient reports doing very well.  He is teaching at [employer].  He remains active.  Today, he denies symptoms of palpitations, chest pain, shortness of breath,  lower extremity edema, dizziness, presyncope, or syncope.  The patient is otherwise without complaint today.   Past Medical History  Diagnosis Date  . DM2 (diabetes mellitus, type 2)   . Obesity   . HTN (hypertension)   . HLD (hyperlipidemia)   . Nephrolithiasis   . Charcot Marie Tooth muscular atrophy     wears braces on his legs  . Second degree Mobitz II AV block   . S/P cardiac pacemaker procedure 11/12/2013  . Hyperlipemia 11/12/2013  . Cardiac pacemaker in situ    Past Surgical History  Procedure Laterality Date  . Kidney stone surgery    . Shoulder arthroscopy    . Pacemaker insertion  11/30/11    SJM Accent DR RF implanted by Dr Rayann Heman    Current Outpatient Prescriptions  Medication Sig Dispense Refill  . aspirin 81 MG tablet Take 81 mg by mouth daily.        Marland Kitchen atorvastatin (LIPITOR) 20 MG tablet Take 20 mg by mouth daily.        . Chromium Picolinate 1000 MCG TABS Take by mouth daily.      . INVOKANA 300 MG TABS daily.       Marland Kitchen lisinopril (PRINIVIL,ZESTRIL) 20 MG tablet Take 20 mg by mouth daily.        . Multiple Vitamin (MULTIVITAMIN) capsule Take 1 capsule by mouth daily.        . repaglinide (PRANDIN) 2 MG tablet Take 2 mg by mouth daily.       No current facility-administered medications for this visit.    Physical Exam: Filed Vitals:   01/01/14 1200  BP: 134/76  Pulse: 72  Height: 5\' 11"  (1.803 m)  Weight: 204 lb (92.534 kg)    GEN- The patient is well appearing, alert and oriented x 3 today.   Head- normocephalic, atraumatic Eyes-  Sclera clear, conjunctiva pink Ears- hearing  intact Oropharynx- clear Lungs- Clear to ausculation bilaterally, normal work of breathing Chest- pacemaker pocket is well healed Heart- Regular rate and rhythm, no murmurs, rubs or gallops, PMI not laterally displaced GI- soft, NT, ND, + BS Extremities- no clubbing, cyanosis, or edema  Pacemaker interrogation- reviewed in detail today,  See PACEART report  Assessment and Plan:  1. Complete heart block Normal pacemaker function See Pace Art report No changes today  2. Atrial tachycardia Mode switches are reviewed today and appear more consistent with atrial tachycardia than afib. No changes today chads2vasc score is at least 4.  Consider anticoagulation if he has afib going forward.  3. HTn Stable No change required today  Merlin Return to see Jerene Pitch in 1 year

## 2014-01-01 NOTE — Patient Instructions (Signed)
Your physician wants you to follow-up in: 12 months with Calvin Kemp You will receive a reminder letter in the mail two months in advance. If you don't receive a letter, please call our office to schedule the follow-up appointment.    Remote monitoring is used to monitor your Pacemaker or ICD from home. This monitoring reduces the number of office visits required to check your device to one time per year. It allows [place] to keep an eye on the functioning of your device to ensure it is working properly. You are scheduled for a device check from home on 04/06/14. You may send your transmission at any time that day. If you have a wireless device, the transmission will be sent automatically. After your physician reviews your transmission, you will receive a postcard with your next transmission date.

## 2014-01-02 ENCOUNTER — Encounter: Payer: Self-pay | Admitting: Podiatrist

## 2014-01-02 ENCOUNTER — Ambulatory Visit (INDEPENDENT_AMBULATORY_CARE_PROVIDER_SITE_OTHER): Payer: Medicare Other | Admitting: Podiatrist

## 2014-01-02 VITALS — BP 132/63 | HR 69 | Resp 18

## 2014-01-02 DIAGNOSIS — B351 Tinea unguium: Secondary | ICD-10-CM | POA: Diagnosis not present

## 2014-01-02 MED ORDER — TERBINAFINE HCL 250 MG PO TABS: 250.0000 mg | ORAL_TABLET | Freq: Every day | ORAL | Status: DC

## 2014-01-02 MED ORDER — LIDOCAINE-PRILOCAINE 2.5-2.5 % EX CREA: 1.0000 "application " | TOPICAL_CREAM | CUTANEOUS | Status: DC | PRN

## 2014-01-02 NOTE — Patient Instructions (Signed)
Take the lamisil for 1 more month-  The prescription has been written for you to take to [facility] as it is the cheapest for this medication $4.  EMLA cream is a topical pain cream-- apply it to the toe and cover with a bandaid when the toe is painful

## 2014-01-02 NOTE — Progress Notes (Signed)
   Subjective:    Patient ID: Calvin Kemp, male    DOB: July 27, 1936, 78 y.o.   MRN: [id number]  HPI patient presents today for pain on his left great toenail. He states " I have been here before about my left toenail and it is thick and has some soreness to it and discolored and was prescribed lamisil and could not tell a difference and started lotrimen and have been using it for about 2 weeks now".  He finished the Lamisil about 4 months ago and relates minimal improvement in the appearance of the toe.    Review of Systems  Negative at today's visit.     Objective:   Physical Exam GENERAL APPEARANCE: Alert, conversant. Appropriately groomed. No acute distress.  VASCULAR: Pedal pulses palpable and strong bilateral.  Capillary refill time is immediate to all digits,  Proximal to distal cooling it warm to warm.  Digital hair growth is present bilateral  NEUROLOGIC: sensation is intact epicritically and protectively to 5.07 monofilament at 5/5 sites bilateral.  Light touch is intact bilateral, vibratory sensation intact bilateral, achilles tendon reflex is intact bilateral.  MUSCULOSKELETAL: acceptable muscle strength, tone and stability bilateral.  Intrinsic muscluature intact bilateral.  Rectus appearance of foot and digits noted bilateral.   DERMATOLOGIC: Left hallux nail is thickened discolored an ingrown to the distal tip of the skin. The proximal portion of the nail does appear to be growing out more normally as this appears to be the area that Lamisil had taken effect. Otherwise skin color, texture and turgor are all within normal limits.     Assessment & Plan:  Onychomycosis left hallux nail  Plan: Anesthetized the toe with lidocaine and Marcaine mixture without complication. A thorough debridement was then carried out. The patient was given one more month of Lamisil therapy if it does appear to have improved the proximal portion of the nail.  The patient also had discomfort during  today's visit and are at a prescription for EMLA cream for him to use a him as well on the toe when necessary.

## 2014-01-08 ENCOUNTER — Telehealth: Payer: Self-pay | Admitting: Internal Medicine

## 2014-01-08 NOTE — Telephone Encounter (Signed)
New message     Did we get his pacemaker remote transmission?

## 2014-01-09 NOTE — Telephone Encounter (Signed)
LMOM

## 2014-01-09 NOTE — Telephone Encounter (Signed)
Pt has cell adapter but resides in an area that has poor cell reception. We attempted to transmit twice. Pt has one landline phone jack but does not have a splitter. Due to current challenges to transmit from home, pt would like to be seen every six months.  Next device clinic visit 06/2014.

## 2014-02-18 DIAGNOSIS — E119 Type 2 diabetes mellitus without complications: Secondary | ICD-10-CM | POA: Diagnosis not present

## 2014-03-04 DIAGNOSIS — E1142 Type 2 diabetes mellitus with diabetic polyneuropathy: Secondary | ICD-10-CM | POA: Diagnosis not present

## 2014-03-04 DIAGNOSIS — E663 Overweight: Secondary | ICD-10-CM | POA: Diagnosis not present

## 2014-03-04 DIAGNOSIS — E1149 Type 2 diabetes mellitus with other diabetic neurological complication: Secondary | ICD-10-CM | POA: Diagnosis not present

## 2014-03-04 DIAGNOSIS — Z6828 Body mass index (BMI) 28.0-28.9, adult: Secondary | ICD-10-CM | POA: Diagnosis not present

## 2014-07-01 ENCOUNTER — Ambulatory Visit (INDEPENDENT_AMBULATORY_CARE_PROVIDER_SITE_OTHER): Payer: Medicare Other | Admitting: *Deleted

## 2014-07-01 DIAGNOSIS — I442 Atrioventricular block, complete: Secondary | ICD-10-CM | POA: Diagnosis not present

## 2014-07-01 LAB — MDC_IDC_ENUM_SESS_TYPE_INCLINIC
Battery Remaining Longevity: 126 mo
Battery Voltage: 2.96 V
Brady Statistic RA Percent Paced: 6.5 %
Brady Statistic RV Percent Paced: 99.94 %
Implantable Pulse Generator Model: 2210
Lead Channel Impedance Value: 462.5 Ohm
Lead Channel Pacing Threshold Amplitude: 0.75 V
Lead Channel Sensing Intrinsic Amplitude: 2.4 mV
Lead Channel Sensing Intrinsic Amplitude: 9.7 mV
Lead Channel Setting Pacing Amplitude: 0.875
Lead Channel Setting Pacing Amplitude: 2 V
Lead Channel Setting Pacing Pulse Width: 0.5 ms
MDC IDC MSMT LEADCHNL RA PACING THRESHOLD PULSEWIDTH: 0.5 ms
MDC IDC MSMT LEADCHNL RV IMPEDANCE VALUE: 462.5 Ohm
MDC IDC MSMT LEADCHNL RV PACING THRESHOLD AMPLITUDE: 0.625 V
MDC IDC MSMT LEADCHNL RV PACING THRESHOLD PULSEWIDTH: 0.5 ms
MDC IDC PG SERIAL: 7309337
MDC IDC SESS DTM: 20150715112023
MDC IDC SET LEADCHNL RV SENSING SENSITIVITY: 2.5 mV

## 2014-07-01 NOTE — Progress Notes (Signed)
Pacemaker check in clinic. Normal device function. Thresholds, sensing, impedances consistent with previous measurements. Device programmed to maximize longevity. 21 mode switches--longest was 3 minutes 10 seconds. No high ventricular rates noted. Device programmed at appropriate safety margins. Histogram distribution appropriate for patient activity level. Device programmed to optimize intrinsic conduction. Estimated longevity 9.8 to 10.5 years. ROV in 6 mths w/JA.

## 2014-07-07 DIAGNOSIS — Z125 Encounter for screening for malignant neoplasm of prostate: Secondary | ICD-10-CM | POA: Diagnosis not present

## 2014-07-07 DIAGNOSIS — Z23 Encounter for immunization: Secondary | ICD-10-CM | POA: Diagnosis not present

## 2014-07-07 DIAGNOSIS — G600 Hereditary motor and sensory neuropathy: Secondary | ICD-10-CM | POA: Diagnosis not present

## 2014-07-07 DIAGNOSIS — Z0000 Encounter for general adult medical examination without abnormal findings: Secondary | ICD-10-CM | POA: Diagnosis not present

## 2014-07-23 ENCOUNTER — Encounter: Payer: Self-pay | Admitting: Internal Medicine

## 2014-08-03 DIAGNOSIS — S93409A Sprain of unspecified ligament of unspecified ankle, initial encounter: Secondary | ICD-10-CM | POA: Diagnosis not present

## 2014-08-06 DIAGNOSIS — M25579 Pain in unspecified ankle and joints of unspecified foot: Secondary | ICD-10-CM | POA: Diagnosis not present

## 2014-09-02 DIAGNOSIS — E1142 Type 2 diabetes mellitus with diabetic polyneuropathy: Secondary | ICD-10-CM | POA: Diagnosis not present

## 2014-09-02 DIAGNOSIS — Z6828 Body mass index (BMI) 28.0-28.9, adult: Secondary | ICD-10-CM | POA: Diagnosis not present

## 2014-09-02 DIAGNOSIS — Z125 Encounter for screening for malignant neoplasm of prostate: Secondary | ICD-10-CM | POA: Diagnosis not present

## 2014-09-02 DIAGNOSIS — E781 Pure hyperglyceridemia: Secondary | ICD-10-CM | POA: Diagnosis not present

## 2014-09-02 DIAGNOSIS — E663 Overweight: Secondary | ICD-10-CM | POA: Diagnosis not present

## 2014-09-02 DIAGNOSIS — E1149 Type 2 diabetes mellitus with other diabetic neurological complication: Secondary | ICD-10-CM | POA: Diagnosis not present

## 2014-09-04 DIAGNOSIS — R197 Diarrhea, unspecified: Secondary | ICD-10-CM | POA: Diagnosis not present

## 2014-09-23 DIAGNOSIS — D1801 Hemangioma of skin and subcutaneous tissue: Secondary | ICD-10-CM | POA: Diagnosis not present

## 2014-09-23 DIAGNOSIS — L723 Sebaceous cyst: Secondary | ICD-10-CM | POA: Diagnosis not present

## 2014-09-23 DIAGNOSIS — L821 Other seborrheic keratosis: Secondary | ICD-10-CM | POA: Diagnosis not present

## 2014-11-02 DIAGNOSIS — H1012 Acute atopic conjunctivitis, left eye: Secondary | ICD-10-CM | POA: Diagnosis not present

## 2014-11-13 ENCOUNTER — Ambulatory Visit: Payer: Medicare Other | Admitting: Cardiology

## 2014-11-25 ENCOUNTER — Encounter: Payer: Self-pay | Admitting: Cardiology

## 2014-11-25 ENCOUNTER — Ambulatory Visit (INDEPENDENT_AMBULATORY_CARE_PROVIDER_SITE_OTHER): Payer: Medicare Other | Admitting: Cardiology

## 2014-11-25 VITALS — BP 132/78 | HR 87 | Ht 71.0 in | Wt 205.0 lb

## 2014-11-25 DIAGNOSIS — E785 Hyperlipidemia, unspecified: Secondary | ICD-10-CM | POA: Insufficient documentation

## 2014-11-25 DIAGNOSIS — R011 Cardiac murmur, unspecified: Secondary | ICD-10-CM

## 2014-11-25 DIAGNOSIS — Z950 Presence of cardiac pacemaker: Secondary | ICD-10-CM | POA: Diagnosis not present

## 2014-11-25 DIAGNOSIS — I10 Essential (primary) hypertension: Secondary | ICD-10-CM | POA: Diagnosis not present

## 2014-11-25 NOTE — Patient Instructions (Addendum)
Your physician wants you to follow-up in: 1 YEAR with Dr Marlou Porch.  You will receive a reminder letter in the mail two months in advance. If you don't receive a letter, please call our office to schedule the follow-up appointment.  Your physician recommends that you continue on your current medications as directed. Please refer to the Current Medication list given to you today.  Your physician has requested that you have an echocardiogram. Echocardiography is a painless test that uses sound waves to create images of your heart. It provides your doctor with information about the size and shape of your heart and how well your heart's chambers and valves are working. This procedure takes approximately one hour. There are no restrictions for this procedure.  You have been referred to Dr Laurence Spates at [facility]

## 2014-11-25 NOTE — Progress Notes (Signed)
[place]. [address] [place], [place]  [address] Phone: [phone] Fax:  [phone]  Date:  11/25/2014   ID:  Calvin Kemp, DOB 1936/07/25, MRN [id number]  PCP:   Calvin Crutch, MD   History of Present Illness: Calvin Kemp is a 78 y.o. male with hypertension, hyperlipidemia, diabetes with peripheral neuropathy, who underwent pacemaker implantation after discovery of 2:1 AV block here for followup.  During pacemaker evaluation, discovered occasional bursts of atrial fibrillation/mode switching.   I gave him prescription for Xarelto 20 mg. He did not take it for very long because of gum bleeding. We discussed the implications of this, increased stroke rate. He is taking aspirin. Previous creatinine was in the 1.2 1.3 range.  He is asymptomatic, no prior strokes, no prior heart failure. He does have diabetes, age, hypertension. he has been traveling to [place], and is hopeful for [employer] ([employer].  We had lengthy discussion about implications of not taking anticoagulation including stroke. I strongly encouraged him to see a dentist or to discuss further his gum bleeding episodes. I strongly encouraged him to resume his anticoagulation. He understands the risk of stroke.    Wt Readings from Last 3 Encounters:  11/25/14 205 lb (92.987 kg)  01/01/14 204 lb (92.534 kg)  11/12/13 208 lb (94.348 kg)     Past Medical History  Diagnosis Date  . DM2 (diabetes mellitus, type 2)   . Obesity   . HTN (hypertension)   . HLD (hyperlipidemia)   . Nephrolithiasis   . Charcot Marie Tooth muscular atrophy     wears braces on his legs  . Second degree Mobitz II AV block   . S/P cardiac pacemaker procedure 11/12/2013  . Hyperlipemia 11/12/2013  . Cardiac pacemaker in situ     Past Surgical History  Procedure Laterality Date  . Kidney stone surgery    . Shoulder arthroscopy    . Pacemaker insertion  11/30/11    SJM Accent DR RF implanted by Dr Rayann Heman     Current Outpatient Prescriptions  Medication Sig Dispense Refill  . aspirin 81 MG tablet Take 81 mg by mouth daily.      Jolyne Loa Grape-Goldenseal (BERBERINE COMPLEX PO) Take by mouth.    . Chromium Picolinate 1000 MCG TABS Take by mouth daily.    . Multiple Vitamin (MULTIVITAMIN) capsule Take 1 capsule by mouth daily.      . NON FORMULARY Glucose Support from [place]     No current facility-administered medications for this visit.    Allergies:   No Known Allergies  Social History:  The patient  reports that he has never smoked. He does not have any smokeless tobacco history on file. He reports that he drinks alcohol. He reports that he does not use illicit drugs.   ROS:  Please see the history of present illness.   No syncope, no bleeding, no chest pain    PHYSICAL EXAM: VS:  BP 132/78 mmHg  Pulse 87  Ht 5\' 11"  (1.803 m)  Wt 205 lb (92.987 kg)  BMI 28.60 kg/m2 Well nourished, well developed, in no acute distress HEENT: normal Neck: no JVD Cardiac:  normal S1, S2; RRR; 2/6 SEM murmur Lungs:  clear to auscultation bilaterally, no wheezing, rhonchi or rales Abd: soft, nontender, no hepatomegaly Ext: no edema Skin: warm and dry Neuro: no focal abnormalities noted  EKG: 11/25/14-sinus rhythm, 87, paced left ventricle, left bundle branch block morphology.  ASSESSMENT AND PLAN:  1. PAF - Brief episode of atrial fibrillation seen on monitor/pacemaker in the past. See discussion above. Dr. Rayann Heman will be interrogating his pacemaker. If further episodes of mode switch are more consistent with atrial fibrillation in the future, consider pushing anticoagulation once again. Previously, the mode switches were reviewed by Dr. Rayann Heman and appear to be more consistent with atrial tachycardia. 2. Hyperlipidemia-triglycerides remain elevated in the 270 range which have been lifelong for him. He is on statin therapy. His LDL was 73. Continue with low fat  diet. 3. Hypertension-currently reasonably controlled. Continue to monitor. Continue with ACE inhibitor. 4. Heart murmur-we will check echocardiogram. Possible mild aortic stenosis. Previously demonstrated aortic valve calcification several years ago. 5. One-year follow-up  Signed, Candee Furbish, MD [facility]  11/25/2014 10:19 AM

## 2014-11-30 ENCOUNTER — Telehealth: Payer: Self-pay | Admitting: Cardiology

## 2014-11-30 NOTE — Telephone Encounter (Signed)
New Msg    Pt calling states that he was supposed to get a referral from Dr. Marlou Porch to see orthopedics.   Please call at [phone].

## 2014-11-30 NOTE — Telephone Encounter (Signed)
Spoke with Dr Romona Curls office.  They attempted to contact the patient on Thursday however were not able to contact him.  Pt is aware their office will contact him again today.  Pt asked for their phone number which was provided.

## 2014-12-01 DIAGNOSIS — E08628 Diabetes mellitus due to underlying condition with other skin complications: Secondary | ICD-10-CM | POA: Diagnosis not present

## 2014-12-01 DIAGNOSIS — E1065 Type 1 diabetes mellitus with hyperglycemia: Secondary | ICD-10-CM | POA: Diagnosis not present

## 2014-12-01 DIAGNOSIS — Z7901 Long term (current) use of anticoagulants: Secondary | ICD-10-CM | POA: Diagnosis not present

## 2014-12-01 DIAGNOSIS — E782 Mixed hyperlipidemia: Secondary | ICD-10-CM | POA: Diagnosis not present

## 2014-12-02 ENCOUNTER — Ambulatory Visit (HOSPITAL_COMMUNITY): Payer: Medicare Other | Attending: Cardiology | Admitting: Radiology

## 2014-12-02 DIAGNOSIS — I351 Nonrheumatic aortic (valve) insufficiency: Secondary | ICD-10-CM | POA: Diagnosis not present

## 2014-12-02 DIAGNOSIS — I10 Essential (primary) hypertension: Secondary | ICD-10-CM | POA: Diagnosis not present

## 2014-12-02 DIAGNOSIS — R011 Cardiac murmur, unspecified: Secondary | ICD-10-CM | POA: Diagnosis not present

## 2014-12-02 DIAGNOSIS — E785 Hyperlipidemia, unspecified: Secondary | ICD-10-CM | POA: Insufficient documentation

## 2014-12-02 DIAGNOSIS — E119 Type 2 diabetes mellitus without complications: Secondary | ICD-10-CM | POA: Diagnosis not present

## 2014-12-02 NOTE — Progress Notes (Signed)
Echocardiogram performed.  

## 2014-12-07 ENCOUNTER — Telehealth: Payer: Self-pay | Admitting: Cardiology

## 2014-12-07 ENCOUNTER — Other Ambulatory Visit: Payer: Self-pay | Admitting: *Deleted

## 2014-12-07 DIAGNOSIS — R931 Abnormal findings on diagnostic imaging of heart and coronary circulation: Secondary | ICD-10-CM

## 2014-12-07 NOTE — Telephone Encounter (Signed)
Pt aware of results and need for further testing.  Reviewed instructions, orders placed and pt will await a call to be scheduled.

## 2014-12-07 NOTE — Telephone Encounter (Signed)
New Msg     Pt returning call please contact at [phone].

## 2014-12-08 DIAGNOSIS — E1142 Type 2 diabetes mellitus with diabetic polyneuropathy: Secondary | ICD-10-CM | POA: Diagnosis not present

## 2014-12-29 DIAGNOSIS — G600 Hereditary motor and sensory neuropathy: Secondary | ICD-10-CM | POA: Diagnosis not present

## 2014-12-31 ENCOUNTER — Ambulatory Visit (HOSPITAL_COMMUNITY): Payer: Medicare Other | Attending: Cardiovascular Disease | Admitting: Radiology

## 2014-12-31 DIAGNOSIS — Z794 Long term (current) use of insulin: Secondary | ICD-10-CM | POA: Diagnosis not present

## 2014-12-31 DIAGNOSIS — I480 Paroxysmal atrial fibrillation: Secondary | ICD-10-CM | POA: Diagnosis not present

## 2014-12-31 DIAGNOSIS — I447 Left bundle-branch block, unspecified: Secondary | ICD-10-CM | POA: Insufficient documentation

## 2014-12-31 DIAGNOSIS — R931 Abnormal findings on diagnostic imaging of heart and coronary circulation: Secondary | ICD-10-CM | POA: Diagnosis not present

## 2014-12-31 DIAGNOSIS — E119 Type 2 diabetes mellitus without complications: Secondary | ICD-10-CM | POA: Insufficient documentation

## 2014-12-31 DIAGNOSIS — I10 Essential (primary) hypertension: Secondary | ICD-10-CM | POA: Insufficient documentation

## 2014-12-31 MED ORDER — TECHNETIUM TC 99M SESTAMIBI GENERIC - CARDIOLITE
11.0000 | Freq: Once | INTRAVENOUS | Status: AC | PRN
  Administered 2014-12-31: 11 via INTRAVENOUS

## 2014-12-31 MED ORDER — ADENOSINE (DIAGNOSTIC) 3 MG/ML IV SOLN
0.5600 mg/kg | Freq: Once | INTRAVENOUS | Status: AC
  Administered 2014-12-31: 50.1 mg via INTRAVENOUS

## 2014-12-31 MED ORDER — TECHNETIUM TC 99M SESTAMIBI GENERIC - CARDIOLITE
33.0000 | Freq: Once | INTRAVENOUS | Status: AC | PRN
Start: 2014-12-31 — End: 2014-12-31
  Administered 2014-12-31: 33 via INTRAVENOUS

## 2014-12-31 NOTE — Progress Notes (Signed)
[place] [facility] [address] [place], [place] [address] [phone]    Cardiology Nuclear Med Study  Calvin Kemp is a 79 y.o. male     MRN : [id number]     DOB: [birth year]  Procedure Date: 12/31/2014  Nuclear Med Background Indication for Stress Test:  Evaluation for Ischemia and Abnl Echo with decreased EF History:  AFib; Pacer Cardiac Risk Factors: Hypertension, IDDM Type 2 and LBBB  Symptoms:  Abnornal ECHO with Decreased EF   Nuclear Pre-Procedure Caffeine/Decaff Intake:  None NPO After: 8:00pm   Lungs:  clear O2 Sat: 97% on room air. IV 0.9% NS with Angio Cath:  22g  IV Site: R Hand  IV Started by:  Matilde Haymaker, RN  Chest Size (in):  44 Cup Size: n/a  Height: 5\' 11"  (1.803 m)  Weight:  197 lb (89.359 kg)  BMI:  Body mass index is 27.49 kg/(m^2). Tech Comments:  n/a    Nuclear Med Study 1 or 2 day study: 1 day  Stress Test Type:  Adenosine  Reading MD: Liane Comber, MD  Order Authorizing Provider:  Jerilynn Mages. Skains, MD  Resting Radionuclide: Technetium 71m Sestamibi  Resting Radionuclide Dose: 11.0 mCi   Stress Radionuclide:  Technetium 32m Sestamibi  Stress Radionuclide Dose: 33.0 mCi           Stress Protocol Rest HR: 61 Stress HR: 75  Rest BP: 128/62 Stress BP: 154/56  Exercise Time (min): n/a METS: n/a   Predicted Max HR: 144 bpm % Max HR: 52.08 bpm Rate Pressure Product: 11550   Dose of Adenosine (mg):  50.1 Dose of Lexiscan: n/a mg  Dose of Atropine (mg): n/a Dose of Dobutamine: n/a mcg/kg/min (at max HR)  Stress Test Technologist: Perrin Maltese, EMT-P  Nuclear Technologist:  Earl Many, CNMT     Rest Procedure:  Myocardial perfusion imaging was performed at rest 45 minutes following the intravenous administration of Technetium 49m Sestamibi. Rest ECG: A-sensed, V paced rhythm  Stress Procedure:  The patient received IV adenosine at 140 mcg/kg/min for 4 minutes. This patient had chest pressure, nausea, and just felt  bad with the Adenosine infusion. Technetium 23m Sestamibi was injected at the 2 minute mark and quantitative spect images were obtained after a 45 minute delay. Stress ECG: Uninteretable due to baseline paced rhythm  QPS Raw Data Images:  There is interference from nuclear activity from structures below the diaphragm. This does not affect the ability to read the study. Stress Images:  Decreased uptake in distal anterior, septal and mid anteroseptal walls, as well as in basal and mid inferior and inferoseptal walls.  Rest Images:  Decreased uptake in distal anterior, septal and mid anteroseptal walls, as well as in basal and mid inferior and inferoseptal walls.  Subtraction (SDS):  No evidence of ischemia. Transient Ischemic Dilatation (Normal <1.22):  1.04 Lung/Heart Ratio (Normal <0.45):  0.28  Quantitative Gated Spect Images QGS EDV:  131 ml QGS ESV:  74 ml  Impression Exercise Capacity:  Lexiscan with no exercise. BP Response:  Normal blood pressure response. Clinical Symptoms:  Chest pressure and nausea. ECG Impression:  Baseline:  LBBB.  EKG uninterpretable due to LBBB at rest and stress. Comparison with Prior Nuclear Study: No previous nuclear study performed  Overall Impression:  Intermediate risk stress nuclear study with two defects:  1. Medium size, severe severity irreversible defect in the mid anteroseptal, distal septal, anterior walls and in the true apex consistent with prior  infacrt in the mid/distal LAD. No ischemia. 2. Medium size, severe severity irreversible defect in the basal and mid inferoseptal and inferior walls consistent with prior I in the PDA territory. No ischemia.   LV Ejection Fraction: 43%.  LV Wall Motion:  Akinesis of the mid anteroseptal, distal anterior, septal walls and hypokinesis of the basal and mid inferior and inferoseptal walls.     [name] 12/31/2014

## 2015-01-12 ENCOUNTER — Telehealth: Payer: Self-pay | Admitting: Cardiology

## 2015-01-12 NOTE — Telephone Encounter (Signed)
Left pt a message to call back. 

## 2015-01-12 NOTE — Telephone Encounter (Signed)
New Message  Pt called back for Myocardial perfusion follow up

## 2015-01-13 NOTE — Telephone Encounter (Signed)
Left message to call back to schedule f/u appt with Dr Marlou Porch - possibly needs a cardiac cath based on the results of his most recent stress testing.

## 2015-01-15 NOTE — Telephone Encounter (Signed)
Follow Up   Pt retutned call//sr Scheduled appt//sr

## 2015-01-18 NOTE — Telephone Encounter (Signed)
At the time the appt was made there were no options for a sooner appt. Due to a cancellation was able to move appt up. Called pt to confirm appt changes!!

## 2015-01-18 NOTE — Telephone Encounter (Signed)
Appointment on 2/22 is not suitable - patient needs to see Dr. Marlou Porch either tomorrow or Wednesday.  Please call patient and reschedule.

## 2015-01-21 ENCOUNTER — Ambulatory Visit (INDEPENDENT_AMBULATORY_CARE_PROVIDER_SITE_OTHER): Payer: Medicare Other | Admitting: Cardiology

## 2015-01-21 ENCOUNTER — Encounter: Payer: Self-pay | Admitting: Cardiology

## 2015-01-21 ENCOUNTER — Encounter: Payer: Self-pay | Admitting: *Deleted

## 2015-01-21 VITALS — BP 124/72 | HR 66 | Ht 71.0 in | Wt 206.0 lb

## 2015-01-21 DIAGNOSIS — E785 Hyperlipidemia, unspecified: Secondary | ICD-10-CM | POA: Diagnosis not present

## 2015-01-21 DIAGNOSIS — R9439 Abnormal result of other cardiovascular function study: Secondary | ICD-10-CM | POA: Insufficient documentation

## 2015-01-21 DIAGNOSIS — R931 Abnormal findings on diagnostic imaging of heart and coronary circulation: Secondary | ICD-10-CM | POA: Diagnosis not present

## 2015-01-21 DIAGNOSIS — I10 Essential (primary) hypertension: Secondary | ICD-10-CM

## 2015-01-21 DIAGNOSIS — I480 Paroxysmal atrial fibrillation: Secondary | ICD-10-CM | POA: Diagnosis not present

## 2015-01-21 DIAGNOSIS — Z950 Presence of cardiac pacemaker: Secondary | ICD-10-CM | POA: Diagnosis not present

## 2015-01-21 DIAGNOSIS — E1142 Type 2 diabetes mellitus with diabetic polyneuropathy: Secondary | ICD-10-CM | POA: Diagnosis not present

## 2015-01-21 NOTE — Progress Notes (Signed)
[place]. [address], [address], [place]  [address] Phone: [phone] Fax:  [phone]  Date:  01/21/2015   ID:  Calvin Kemp, DOB 1936-04-06, MRN [id number]  PCP:   Melinda Crutch, MD   History of Present Illness: Calvin Kemp is a 79 y.o. male with hypertension, hyperlipidemia, diabetes with peripheral neuropathy, who underwent pacemaker implantation after discovery of 2:1 AV block here for followup after undergoing nuclear stress test which was intermediate risk showing 2 defects, mid anteroseptal, distal septal, anterior wall consistent with infarct in the mid to distal LAD region with no ischemia as well as irreversible defect in the mid inferoseptal and inferior walls consistent with prior infarct in the PDA territory with no ischemia. Ejection fraction was 43%. This corroborated with echocardiogram. Trivial aortic valve regurgitation.    He is asymptomatic, no prior strokes, no prior heart failure. He does have diabetes, age, hypertension. 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    Wt Readings from Last 3 Encounters:  01/21/15 206 lb (93.441 kg)  12/31/14 197 lb (89.359 kg)  11/25/14 205 lb (92.987 kg)     Past Medical History  Diagnosis Date  . DM2 (diabetes mellitus, type 2)   . Obesity   . HTN (hypertension)   . HLD (hyperlipidemia)   . Nephrolithiasis   . Charcot Marie Tooth muscular atrophy     wears braces on his legs  . Second degree Mobitz II AV block   . S/P cardiac pacemaker procedure 11/12/2013  . Hyperlipemia 11/12/2013  . Cardiac pacemaker in situ     Past Surgical History  Procedure Laterality Date  . Kidney stone surgery    . Shoulder arthroscopy    . Pacemaker insertion  11/30/11    SJM Accent DR RF implanted by Dr Rayann Heman  . Permanent pacemaker insertion N/A 11/30/2011    Procedure: PERMANENT PACEMAKER INSERTION;  Surgeon: Thompson Grayer, MD;  Location: [facility] CATH LAB;  Service: Cardiovascular;  Laterality: N/A;    Current  Outpatient Prescriptions  Medication Sig Dispense Refill  . aspirin 81 MG tablet Take 81 mg by mouth daily.      Marland Kitchen atorvastatin (LIPITOR) 20 MG tablet Take 20 mg by mouth.    Marland Kitchen LEVEMIR FLEXTOUCH 100 UNIT/ML Pen Inject 30 Units into the skin daily.    Marland Kitchen lisinopril (PRINIVIL,ZESTRIL) 20 MG tablet Take 20 mg by mouth.    . Multiple Vitamin (MULTIVITAMIN) capsule Take 1 capsule by mouth daily.      [name] tablet      No current facility-administered medications for this visit.    Allergies:    Allergies  Allergen Reactions  . Rivaroxaban Other (See Comments)    Bleeding gums  . Canagliflozin Diarrhea  . Isradipine Diarrhea  . Linagliptin Diarrhea  . Linagliptin-Metformin Hcl Diarrhea  . Metformin And Related Diarrhea  . Niacin And Related Diarrhea  . Pioglitazone Diarrhea    Social History:  The patient  reports that he has never smoked. He does not have any smokeless tobacco history on file. He reports that he drinks alcohol. He reports that he does not use illicit drugs.   ROS:  Please see the history of present illness.   No syncope, no bleeding, no chest pain    PHYSICAL EXAM: VS:  BP 124/72 mmHg  Pulse 66  Ht 5\' 11"  (1.803 m)  Wt 206 lb (93.441 kg)  BMI 28.74 kg/m2 Well nourished, well developed, in  no acute distress HEENT: normal Neck: no JVD Cardiac:  normal S1, S2; RRR; 2/6 SEM murmur Lungs:  clear to auscultation bilaterally, no wheezing, rhonchi or rales Abd: soft, nontender, no hepatomegaly Ext: no edema Skin: warm and dry Neuro: no focal abnormalities noted  EKG: 11/25/14-sinus rhythm, 87, paced left ventricle, left bundle branch block morphology.     ASSESSMENT AND PLAN:  1. Abnormal nuclear stress test-stress test is demonstrating old infarcts. He does not recall having any chest discomfort previously. In order to further evaluate, I recommend cardiac catheterization to delineate his coronary anatomy. Risks and benefits of procedure including  stroke, heart attack, death have been discussed. This could potentially prove helpful to exclude the possibility of triple-vessel coronary artery disease which if revascularized in that setting could improve likes pain. We discussed at length. He would like to discuss further with his wife at this time. 2. PAF - Brief episode of atrial fibrillation seen on monitor/pacemaker in the past. See discussion above. Dr. Rayann Heman will be interrogating his pacemaker. If further episodes of mode switch are more consistent with atrial fibrillation in the future, consider pushing anticoagulation once again. Previously, the mode switches were reviewed by Dr. Rayann Heman and appear to be more consistent with atrial tachycardia. 3. Hyperlipidemia-triglycerides remain elevated in the 270 range which have been lifelong for him. He is on statin therapy. His LDL was 73. Continue with low fat diet. 4. Hypertension-currently reasonably controlled. Continue to monitor. Continue with ACE inhibitor. 5. One-year follow-up. He will call me with thoughts on proceeding with cardiac catheterization.  Signed, Candee Furbish, MD [facility]  01/21/2015 10:55 AM

## 2015-01-21 NOTE — Patient Instructions (Signed)
The current medical regimen is effective;  continue present plan and medications.  Please call back after you talk to your wife about the cardiac cath.  Follow up in 1 year with Dr. Marlou Porch.  You will receive a letter in the mail 2 months before you are due.  Please call us when you receive this letter to schedule your follow up appointment.  Thank you for choosing [facility]!!

## 2015-01-22 ENCOUNTER — Telehealth: Payer: Self-pay | Admitting: Cardiology

## 2015-01-22 DIAGNOSIS — Z79899 Other long term (current) drug therapy: Secondary | ICD-10-CM

## 2015-01-22 DIAGNOSIS — R9439 Abnormal result of other cardiovascular function study: Secondary | ICD-10-CM

## 2015-01-22 DIAGNOSIS — I482 Chronic atrial fibrillation, unspecified: Secondary | ICD-10-CM

## 2015-01-22 DIAGNOSIS — Z01810 Encounter for preprocedural cardiovascular examination: Secondary | ICD-10-CM

## 2015-01-22 NOTE — Telephone Encounter (Signed)
Thursday the 25th of February would work with me.  Candee Furbish, MD

## 2015-01-22 NOTE — Telephone Encounter (Signed)
Will ask Dr Marlou Porch when he would like to do the cath.

## 2015-01-22 NOTE — Telephone Encounter (Signed)
New Message         Pt calling stating that he is ready to schedule his Angiogram, but it has to be on a Thursday. Please call back.

## 2015-01-26 ENCOUNTER — Encounter: Payer: Self-pay | Admitting: *Deleted

## 2015-01-26 NOTE — Telephone Encounter (Signed)
Left message for pt to call back about scheduling his cath on 02/11/15.

## 2015-01-26 NOTE — Telephone Encounter (Signed)
Pt scheduled for cardiac cath with Dr Marlou Porch 02/11/15 at 10 am.  Reviewed instructions and will mail them to him.  He will come in for pre-cath labs on 2/18.

## 2015-01-28 ENCOUNTER — Ambulatory Visit (INDEPENDENT_AMBULATORY_CARE_PROVIDER_SITE_OTHER): Payer: Medicare Other | Admitting: Internal Medicine

## 2015-01-28 DIAGNOSIS — I442 Atrioventricular block, complete: Secondary | ICD-10-CM

## 2015-01-28 NOTE — Progress Notes (Signed)
   Complete physical exam  Patient: Calvin Kemp   DOB: 10/07/1997   79 y.o. Male  MRN: [id number]  Subjective:    No chief complaint on file.   Calvin Kemp is a 79 y.o. male who presents today for a complete physical exam. She reports consuming a {diet types:17450} diet. {types:19826} She generally feels {DESC; WELL/FAIRLY WELL/POORLY:18703}. She reports sleeping {DESC; WELL/FAIRLY WELL/POORLY:18703}. She {does/does not:200015} have additional problems to discuss today.    Most recent fall risk assessment:    06/14/2022   10:42 AM  Fall Risk   Falls in the past year? 0  Number falls in past yr: 0  Injury with Fall? 0  Risk for fall due to : No Fall Risks  Follow up Falls evaluation completed     Most recent depression screenings:    06/14/2022   10:42 AM 05/05/2021   10:46 AM  PHQ 2/9 Scores  PHQ - 2 Score 0 0  PHQ- 9 Score 5     {VISON DENTAL STD PSA (Optional):27386}  {History (Optional):23778}  Patient Care Team: Jessup, Joy, NP as PCP - General (Nurse Practitioner)   Outpatient Medications Prior to Visit  Medication Sig   fluticasone (FLONASE) 50 MCG/ACT nasal spray Place 2 sprays into both nostrils in the morning and at bedtime. After 7 days, reduce to once daily.   norgestimate-ethinyl estradiol (SPRINTEC 28) 0.25-35 MG-MCG tablet Take 1 tablet by mouth daily.   Nystatin POWD Apply liberally to affected area 2 times per day   spironolactone (ALDACTONE) 100 MG tablet Take 1 tablet (100 mg total) by mouth daily.   No facility-administered medications prior to visit.    ROS        Objective:     There were no vitals taken for this visit. {Vitals History (Optional):23777}  Physical Exam   No results found for any visits on 07/20/22. {Show previous labs (optional):23779}    Assessment & Plan:    Routine Health Maintenance and Physical Exam  Immunization History  Administered Date(s) Administered   DTaP 12/21/1999, 02/16/2000,  04/26/2000, 01/10/2001, 07/26/2004   Hepatitis A 05/22/2008, 05/28/2009   Hepatitis B 10/08/1999, 11/15/1999, 04/26/2000   HiB (PRP-OMP) 12/21/1999, 02/16/2000, 04/26/2000, 01/10/2001   IPV 12/21/1999, 02/16/2000, 10/15/2000, 07/26/2004   Influenza,inj,Quad PF,6+ Mos 08/28/2014   Influenza-Unspecified 11/27/2012   MMR 10/15/2001, 07/26/2004   Meningococcal Polysaccharide 05/27/2012   Pneumococcal Conjugate-13 01/10/2001   Pneumococcal-Unspecified 04/26/2000, 07/10/2000   Tdap 05/27/2012   Varicella 10/15/2000, 05/22/2008    Health Maintenance  Topic Date Due   HIV Screening  Never done   Hepatitis C Screening  Never done   INFLUENZA VACCINE  07/18/2022   PAP-Cervical Cytology Screening  07/20/2022 (Originally 10/06/2020)   PAP SMEAR-Modifier  07/20/2022 (Originally 10/06/2020)   TETANUS/TDAP  07/20/2022 (Originally 05/27/2022)   HPV VACCINES  Discontinued   COVID-19 Vaccine  Discontinued    Discussed health benefits of physical activity, and encouraged her to engage in regular exercise appropriate for her age and condition.  Problem List Items Addressed This Visit   None Visit Diagnoses     Annual physical exam    -  Primary   Cervical cancer screening       Need for Tdap vaccination          No follow-ups on file.     Joy Jessup, NP   

## 2015-02-04 ENCOUNTER — Other Ambulatory Visit (INDEPENDENT_AMBULATORY_CARE_PROVIDER_SITE_OTHER): Payer: Medicare Other | Admitting: *Deleted

## 2015-02-04 DIAGNOSIS — Z01810 Encounter for preprocedural cardiovascular examination: Secondary | ICD-10-CM | POA: Diagnosis not present

## 2015-02-04 DIAGNOSIS — R9439 Abnormal result of other cardiovascular function study: Secondary | ICD-10-CM

## 2015-02-04 DIAGNOSIS — I482 Chronic atrial fibrillation, unspecified: Secondary | ICD-10-CM

## 2015-02-04 DIAGNOSIS — Z79899 Other long term (current) drug therapy: Secondary | ICD-10-CM | POA: Diagnosis not present

## 2015-02-04 LAB — BASIC METABOLIC PANEL
BUN: 21 mg/dL (ref 6–23)
CHLORIDE: 106 meq/L (ref 96–112)
CO2: 28 meq/L (ref 19–32)
Calcium: 9.5 mg/dL (ref 8.4–10.5)
Creatinine, Ser: 1.21 mg/dL (ref 0.40–1.50)
GFR: 61.9 mL/min (ref >60.00)
GLUCOSE: 215 mg/dL — AB (ref 70–99)
POTASSIUM: 4.6 meq/L (ref 3.5–5.1)
SODIUM: 138 meq/L (ref 135–145)

## 2015-02-04 LAB — CBC
HCT: 43.8 % (ref 39.0–52.0)
Hemoglobin: 14.8 g/dL (ref 13.0–17.0)
MCHC: 33.9 g/dL (ref 30.0–36.0)
MCV: 89.3 fl (ref 78.0–100.0)
Platelets: 190 10*3/uL (ref 150.0–400.0)
RBC: 4.9 Mil/uL (ref 4.22–5.81)
RDW: 13.3 % (ref 11.5–15.5)
WBC: 9.1 10*3/uL (ref 4.0–10.5)

## 2015-02-04 LAB — PROTIME-INR
INR: 1 ratio (ref 0.8–1.0)
Prothrombin Time: 10.9 s (ref 9.6–13.1)

## 2015-02-05 NOTE — Addendum Note (Signed)
Addended by: Jerline Pain on: 02/05/2015 07:12 AM   Modules accepted: Orders

## 2015-02-08 ENCOUNTER — Ambulatory Visit: Payer: Medicare Other | Admitting: Cardiology

## 2015-02-10 ENCOUNTER — Telehealth: Payer: Self-pay | Admitting: Cardiology

## 2015-02-10 NOTE — Telephone Encounter (Signed)
Left message for pt to call back.  Left number for the [facility] office.

## 2015-02-10 NOTE — Telephone Encounter (Signed)
New Message  Pt wanted to speak w/ Rn about cath sched for 2/25. Please call back and discuss.

## 2015-02-11 ENCOUNTER — Ambulatory Visit (HOSPITAL_COMMUNITY)
Admission: RE | Admit: 2015-02-11 | Discharge: 2015-02-11 | Disposition: A | Discharge status: Home / Self Care | Payer: Medicare Other | Source: Ambulatory Visit | Attending: Cardiology | Admitting: Cardiology

## 2015-02-11 ENCOUNTER — Encounter (HOSPITAL_COMMUNITY)
Admission: RE | Disposition: A | Discharge status: Home / Self Care | Payer: Self-pay | Source: Ambulatory Visit | Attending: Cardiology

## 2015-02-11 ENCOUNTER — Encounter (HOSPITAL_COMMUNITY): Payer: Self-pay | Admitting: Cardiology

## 2015-02-11 DIAGNOSIS — Z7982 Long term (current) use of aspirin: Secondary | ICD-10-CM | POA: Insufficient documentation

## 2015-02-11 DIAGNOSIS — E1142 Type 2 diabetes mellitus with diabetic polyneuropathy: Secondary | ICD-10-CM | POA: Insufficient documentation

## 2015-02-11 DIAGNOSIS — E669 Obesity, unspecified: Secondary | ICD-10-CM | POA: Diagnosis not present

## 2015-02-11 DIAGNOSIS — I10 Essential (primary) hypertension: Secondary | ICD-10-CM | POA: Diagnosis not present

## 2015-02-11 DIAGNOSIS — E785 Hyperlipidemia, unspecified: Secondary | ICD-10-CM | POA: Diagnosis not present

## 2015-02-11 DIAGNOSIS — Z950 Presence of cardiac pacemaker: Secondary | ICD-10-CM | POA: Diagnosis present

## 2015-02-11 DIAGNOSIS — R9439 Abnormal result of other cardiovascular function study: Secondary | ICD-10-CM | POA: Diagnosis not present

## 2015-02-11 DIAGNOSIS — I2510 Atherosclerotic heart disease of native coronary artery without angina pectoris: Secondary | ICD-10-CM | POA: Insufficient documentation

## 2015-02-11 DIAGNOSIS — I351 Nonrheumatic aortic (valve) insufficiency: Secondary | ICD-10-CM | POA: Diagnosis not present

## 2015-02-11 DIAGNOSIS — I480 Paroxysmal atrial fibrillation: Secondary | ICD-10-CM | POA: Diagnosis present

## 2015-02-11 HISTORY — PX: LEFT HEART CATHETERIZATION WITH CORONARY ANGIOGRAM: SHX5451

## 2015-02-11 LAB — GLUCOSE, CAPILLARY
Glucose-Capillary: 130 mg/dL — ABNORMAL HIGH (ref 70–99)
Glucose-Capillary: 123 mg/dL — ABNORMAL HIGH (ref 70–99)

## 2015-02-11 SURGERY — LEFT HEART CATHETERIZATION WITH CORONARY ANGIOGRAM
Anesthesia: LOCAL

## 2015-02-11 MED ORDER — SODIUM CHLORIDE 0.9 % IV SOLN
250.0000 mL | INTRAVENOUS | Status: DC | PRN
Start: 2015-02-11 — End: 2015-02-11

## 2015-02-11 MED ORDER — LIDOCAINE HCL (PF) 1 % IJ SOLN
INTRAMUSCULAR | Status: AC
  Filled 2015-02-11: qty 30

## 2015-02-11 MED ORDER — SODIUM CHLORIDE 0.9 % IJ SOLN: 3.0000 mL | Freq: Two times a day (BID) | INTRAMUSCULAR | Status: DC

## 2015-02-11 MED ORDER — MIDAZOLAM HCL 2 MG/2ML IJ SOLN
INTRAMUSCULAR | Status: AC
  Filled 2015-02-11: qty 2

## 2015-02-11 MED ORDER — SODIUM CHLORIDE 0.9 % IV SOLN
INTRAVENOUS | Status: DC
  Administered 2015-02-11: 10:00:00 via INTRAVENOUS

## 2015-02-11 MED ORDER — HEPARIN SODIUM (PORCINE) 1000 UNIT/ML IJ SOLN
INTRAMUSCULAR | Status: AC
  Filled 2015-02-11: qty 1

## 2015-02-11 MED ORDER — SODIUM CHLORIDE 0.9 % IV SOLN
1.0000 mL/kg/h | INTRAVENOUS | Status: DC
Start: 2015-02-11 — End: 2015-02-11

## 2015-02-11 MED ORDER — SODIUM CHLORIDE 0.9 % IJ SOLN: 3.0000 mL | INTRAMUSCULAR | Status: DC | PRN

## 2015-02-11 MED ORDER — VERAPAMIL HCL 2.5 MG/ML IV SOLN
INTRAVENOUS | Status: AC
  Filled 2015-02-11: qty 2

## 2015-02-11 MED ORDER — FENTANYL CITRATE 0.05 MG/ML IJ SOLN
INTRAMUSCULAR | Status: AC
  Filled 2015-02-11: qty 2

## 2015-02-11 MED ORDER — ASPIRIN 81 MG PO CHEW
CHEWABLE_TABLET | ORAL | Status: AC
  Filled 2015-02-11: qty 1

## 2015-02-11 MED ORDER — NITROGLYCERIN 1 MG/10 ML FOR IR/CATH LAB
INTRA_ARTERIAL | Status: AC
  Filled 2015-02-11: qty 10

## 2015-02-11 MED ORDER — HEPARIN (PORCINE) IN NACL 2-0.9 UNIT/ML-% IJ SOLN
INTRAMUSCULAR | Status: AC
  Filled 2015-02-11: qty 1500

## 2015-02-11 MED ORDER — ASPIRIN 81 MG PO CHEW
81.0000 mg | CHEWABLE_TABLET | ORAL | Status: AC
  Administered 2015-02-11: 81 mg via ORAL

## 2015-02-11 NOTE — Discharge Instructions (Signed)
Radial Site Care °Refer to this sheet in the next few weeks. These instructions provide you with information on caring for yourself after your procedure. Your caregiver may also give you more specific instructions. Your treatment has been planned according to current medical practices, but problems sometimes occur. Call your caregiver if you have any problems or questions after your procedure. °HOME CARE INSTRUCTIONS °· You may shower the day after the procedure. Remove the bandage (dressing) and gently wash the site with plain soap and water. Gently pat the site dry. °· Do not apply powder or lotion to the site. °· Do not submerge the affected site in water for 3 to 5 days. °· Inspect the site at least twice daily. °· Do not flex or bend the affected arm for 24 hours. °· No lifting over 5 pounds (2.3 kg) for 5 days after your procedure. °· Do not drive home if you are discharged the same day of the procedure. Have someone else drive you. °· You may drive 24 hours after the procedure unless otherwise instructed by your caregiver. °· Do not operate machinery or power tools for 24 hours. °· A responsible adult should be with you for the first 24 hours after you arrive home. °What to expect: °· Any bruising will usually fade within 1 to 2 weeks. °· Blood that collects in the tissue (hematoma) may be painful to the touch. It should usually decrease in size and tenderness within 1 to 2 weeks. °SEEK IMMEDIATE MEDICAL CARE IF: °· You have unusual pain at the radial site. °· You have redness, warmth, swelling, or pain at the radial site. °· You have drainage (other than a small amount of blood on the dressing). °· You have chills. °· You have a fever or persistent symptoms for more than 72 hours. °· You have a fever and your symptoms suddenly get worse. °· Your arm becomes pale, cool, tingly, or numb. °· You have heavy bleeding from the site. Hold pressure on the site. °Document Released: 01/06/2011 Document Revised:  02/26/2012 Document Reviewed: 01/06/2011 °ExitCare® Patient Information ©2015 [employer]. This information is not intended to replace advice given to you by your health care provider. Make sure you discuss any questions you have with your health care provider. ° °

## 2015-02-11 NOTE — Interval H&P Note (Signed)
History and Physical Interval Note:  Cath Lab Visit (complete for each Cath Lab visit)  Clinical Evaluation Leading to the Procedure:   ACS: No.  Non-ACS:    Anginal Classification: No Symptoms  Anti-ischemic medical therapy: No Therapy  Non-Invasive Test Results: Intermediate-risk stress test findings: cardiac mortality 1-3%/year  Prior CABG: No previous CABG         02/11/2015 6:23 AM  Calvin Kemp  has presented today for surgery, with the diagnosis of abnormal stress test  The various methods of treatment have been discussed with the patient and family. After consideration of risks, benefits and other options for treatment, the patient has consented to  Procedure(s): LEFT HEART CATHETERIZATION WITH CORONARY ANGIOGRAM (N/A) as a surgical intervention .  The patient's history has been reviewed, patient examined, no change in status, stable for surgery.  I have reviewed the patient's chart and labs.  Questions were answered to the patient's satisfaction.     Andrew Soria

## 2015-02-11 NOTE — H&P (View-Only) (Signed)
[facility]. [address], [place]  [address] Phone: [phone] Fax:  [phone]  Date:  01/21/2015   ID:  Calvin Kemp, DOB 08/02/1936, MRN [id number]  PCP:   Melinda Crutch, MD   History of Present Illness: Calvin Kemp is a 79 y.o. male with hypertension, hyperlipidemia, diabetes with peripheral neuropathy, who underwent pacemaker implantation after discovery of 2:1 AV block here for followup after undergoing nuclear stress test which was intermediate risk showing 2 defects, mid anteroseptal, distal septal, anterior wall consistent with infarct in the mid to distal LAD region with no ischemia as well as irreversible defect in the mid inferoseptal and inferior walls consistent with prior infarct in the PDA territory with no ischemia. Ejection fraction was 43%. This corroborated with echocardiogram. Trivial aortic valve regurgitation.    He is asymptomatic, no prior strokes, no prior heart failure. He does have diabetes, age, hypertension. 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    Wt Readings from Last 3 Encounters:  01/21/15 206 lb (93.441 kg)  12/31/14 197 lb (89.359 kg)  11/25/14 205 lb (92.987 kg)     Past Medical History  Diagnosis Date  . DM2 (diabetes mellitus, type 2)   . Obesity   . HTN (hypertension)   . HLD (hyperlipidemia)   . Nephrolithiasis   . Charcot Marie Tooth muscular atrophy     wears braces on his legs  . Second degree Mobitz II AV block   . S/P cardiac pacemaker procedure 11/12/2013  . Hyperlipemia 11/12/2013  . Cardiac pacemaker in situ     Past Surgical History  Procedure Laterality Date  . Kidney stone surgery    . Shoulder arthroscopy    . Pacemaker insertion  11/30/11    SJM Accent DR RF implanted by Dr Rayann Heman  . Permanent pacemaker insertion N/A 11/30/2011    Procedure: PERMANENT PACEMAKER INSERTION;  Surgeon: Thompson Grayer, MD;  Location: [facility] CATH LAB;  Service: Cardiovascular;  Laterality: N/A;    Current  Outpatient Prescriptions  Medication Sig Dispense Refill  . aspirin 81 MG tablet Take 81 mg by mouth daily.      Marland Kitchen atorvastatin (LIPITOR) 20 MG tablet Take 20 mg by mouth.    Marland Kitchen LEVEMIR FLEXTOUCH 100 UNIT/ML Pen Inject 30 Units into the skin daily.    Marland Kitchen lisinopril (PRINIVIL,ZESTRIL) 20 MG tablet Take 20 mg by mouth.    . Multiple Vitamin (MULTIVITAMIN) capsule Take 1 capsule by mouth daily.      [name] tablet      No current facility-administered medications for this visit.    Allergies:    Allergies  Allergen Reactions  . Rivaroxaban Other (See Comments)    Bleeding gums  . Canagliflozin Diarrhea  . Isradipine Diarrhea  . Linagliptin Diarrhea  . Linagliptin-Metformin Hcl Diarrhea  . Metformin And Related Diarrhea  . Niacin And Related Diarrhea  . Pioglitazone Diarrhea    Social History:  The patient  reports that he has never smoked. He does not have any smokeless tobacco history on file. He reports that he drinks alcohol. He reports that he does not use illicit drugs.   ROS:  Please see the history of present illness.   No syncope, no bleeding, no chest pain    PHYSICAL EXAM: VS:  BP 124/72 mmHg  Pulse 66  Ht 5\' 11"  (1.803 m)  Wt 206 lb (93.441 kg)  BMI 28.74 kg/m2 Well nourished, well developed, in  no acute distress HEENT: normal Neck: no JVD Cardiac:  normal S1, S2; RRR; 2/6 SEM murmur Lungs:  clear to auscultation bilaterally, no wheezing, rhonchi or rales Abd: soft, nontender, no hepatomegaly Ext: no edema Skin: warm and dry Neuro: no focal abnormalities noted  EKG: 11/25/14-sinus rhythm, 87, paced left ventricle, left bundle branch block morphology.     ASSESSMENT AND PLAN:  1. Abnormal nuclear stress test-stress test is demonstrating old infarcts. He does not recall having any chest discomfort previously. In order to further evaluate, I recommend cardiac catheterization to delineate his coronary anatomy. Risks and benefits of procedure including  stroke, heart attack, death have been discussed. This could potentially prove helpful to exclude the possibility of triple-vessel coronary artery disease which if revascularized in that setting could improve likes pain. We discussed at length. He would like to discuss further with his wife at this time. 2. PAF - Brief episode of atrial fibrillation seen on monitor/pacemaker in the past. See discussion above. Dr. Rayann Heman will be interrogating his pacemaker. If further episodes of mode switch are more consistent with atrial fibrillation in the future, consider pushing anticoagulation once again. Previously, the mode switches were reviewed by Dr. Rayann Heman and appear to be more consistent with atrial tachycardia. 3. Hyperlipidemia-triglycerides remain elevated in the 270 range which have been lifelong for him. He is on statin therapy. His LDL was 73. Continue with low fat diet. 4. Hypertension-currently reasonably controlled. Continue to monitor. Continue with ACE inhibitor. 5. One-year follow-up. He will call me with thoughts on proceeding with cardiac catheterization.  Signed, Candee Furbish, MD [facility]  01/21/2015 10:55 AM

## 2015-02-11 NOTE — CV Procedure (Signed)
    CARDIAC CATHETERIZATION  PROCEDURE:  Left heart catheterization with selective coronary angiography, left ventriculogram via the radial artery approach.  INDICATIONS:  79 year old with abnormal stress test infarct mid to distal apical anteroseptal infarct pattern no ischemia EF on ECHO 40-45%. He reports no specific symptoms. No chest pain, no shortness of breath.  The risks, benefits, and details of the procedure were explained to the patient, including possibilities of stroke, heart attack, death, renal impairment, arterial damage, bleeding.  The patient verbalized understanding and wanted to proceed.  Informed written consent was obtained.  PROCEDURE TECHNIQUE:  Allen's test was performed pre-and post procedure and was normal. The right radial artery site was prepped and draped in a sterile fashion. One percent lidocaine was used for local anesthesia. Using the modified Seldinger technique a 5 French hydrophilic sheath was inserted into the radial artery without difficulty. 3 mg of verapamil was administered via the sheath. A Judkins right #4 catheter with the guidance of a Versicore wire was placed in the right coronary cusp and selectively cannulated the right coronary artery. After traversing the aortic arch, 4800 units of heparin IV was administered. A Judkins left #3.5 catheter was used to selectively cannulate the left main artery. Multiple views with hand injection of Omnipaque were obtained. Catheter a pigtail catheter was used to cross into the left ventricle, hemodynamics were obtained, and a left ventriculogram was performed in the RAO position with power injection. Following the procedure, sheath was removed, patient was hemodynamically stable, hemostasis was maintained with a Terumo T band.   CONTRAST:  Total of 55 ml.    FLOUROSCOPY TIME: 3.4 min.  COMPLICATIONS:  None.    HEMODYNAMICS:  Aortic pressure was 381/77 mmHg; LV systolic pressure was 116 mmHg; LVEDP 10 mmHg.  There  was no gradient between the left ventricle and aorta.    ANGIOGRAPHIC DATA:    Left main: No angiographically significant disease  Left anterior descending (LAD): 2 significant diagonal branches, minor luminal irregularities, mild proximal calcification. In the proximal region surrounding the bifurcation of the circumflex complex artery there is a aneurysmal segment of the artery with a branching septal. No evidence of thrombus.  Circumflex artery (CIRC): There is one large obtuse marginal branch. Minor luminal irregularities proximally.  Right coronary artery (RCA): Distal RCA 75% lesion before the bifurcation of the posterior descending artery. Otherwise minor luminal irregularities.  LEFT VENTRICULOGRAM:  Left ventricular angiogram was done in the 30 RAO projection and revealed mid to distal anteroseptal akinesis with an estimated ejection fraction of 45%.   IMPRESSIONS:  75% distal RCA lesion-continue with medical management, no ischemia on nuclear stress test. Otherwise, minor luminal irregularities. Proximal aneurysmal LAD segment. LVEDP 10 mmHg.  Ejection fraction 45%, mid to distal anteroseptal wall akinesis.  RECOMMENDATION:  Medical management. Continue with aspirin, atorvastatin, diabetes control. No evidence of ischemia in the inferior territory where lesion exists. Anteroseptal akinesis representative of old infarct pattern with no evidence of stenosis in the LAD.

## 2015-02-17 NOTE — Telephone Encounter (Signed)
Pt never called back - had cath 2/25

## 2015-02-25 ENCOUNTER — Ambulatory Visit: Payer: Medicare Other | Admitting: Podiatry

## 2015-03-11 DIAGNOSIS — E1142 Type 2 diabetes mellitus with diabetic polyneuropathy: Secondary | ICD-10-CM | POA: Diagnosis not present

## 2015-03-29 ENCOUNTER — Ambulatory Visit (INDEPENDENT_AMBULATORY_CARE_PROVIDER_SITE_OTHER): Payer: Medicare Other | Admitting: Internal Medicine

## 2015-03-29 ENCOUNTER — Encounter: Payer: Self-pay | Admitting: Internal Medicine

## 2015-03-29 VITALS — BP 120/60 | HR 85 | Ht 72.0 in | Wt 211.8 lb

## 2015-03-29 DIAGNOSIS — I10 Essential (primary) hypertension: Secondary | ICD-10-CM | POA: Diagnosis not present

## 2015-03-29 DIAGNOSIS — I442 Atrioventricular block, complete: Secondary | ICD-10-CM

## 2015-03-29 DIAGNOSIS — I480 Paroxysmal atrial fibrillation: Secondary | ICD-10-CM | POA: Diagnosis not present

## 2015-03-29 LAB — MDC_IDC_ENUM_SESS_TYPE_INCLINIC
Battery Voltage: 2.96 V
Brady Statistic RA Percent Paced: 6.3 %
Brady Statistic RV Percent Paced: 99.97 %
Implantable Pulse Generator Model: 2210
Implantable Pulse Generator Serial Number: 7309337
Lead Channel Impedance Value: 437.5 Ohm
Lead Channel Pacing Threshold Amplitude: 0.75 V
Lead Channel Pacing Threshold Amplitude: 0.75 V
Lead Channel Pacing Threshold Pulse Width: 0.5 ms
Lead Channel Sensing Intrinsic Amplitude: 1.9 mV
Lead Channel Setting Pacing Amplitude: 1 V
MDC IDC MSMT BATTERY REMAINING LONGEVITY: 124.8 mo
MDC IDC MSMT LEADCHNL RV IMPEDANCE VALUE: 462.5 Ohm
MDC IDC MSMT LEADCHNL RV PACING THRESHOLD PULSEWIDTH: 0.5 ms
MDC IDC MSMT LEADCHNL RV SENSING INTR AMPL: 9.7 mV
MDC IDC SESS DTM: 20160411161449
MDC IDC SET LEADCHNL RA PACING AMPLITUDE: 2 V
MDC IDC SET LEADCHNL RV PACING PULSEWIDTH: 0.5 ms
MDC IDC SET LEADCHNL RV SENSING SENSITIVITY: 2.5 mV

## 2015-03-29 NOTE — Patient Instructions (Signed)
Your physician wants you to follow-up in: 6 months in device clinic with Debroah Loop, RN and 12 months with Chanetta Marshall, NP You will receive a reminder letter in the mail two months in advance. If you don't receive a letter, please call our office to schedule the follow-up appointment.  Your physician recommends that you continue on your current medications as directed. Please refer to the Current Medication list given to you today.

## 2015-03-29 NOTE — Progress Notes (Signed)
Electrophysiology Office Note   Date:  03/29/2015   ID:  Calvin Kemp, DOB May 24, 1936, MRN [id number]  PCP:   Melinda Crutch, MD  Cardiologist:  Dr Marlou Porch Primary Electrophysiologist: Thompson Grayer, MD    Chief Complaint  Patient presents with  . Follow-up    CHB     History of Present Illness: Calvin Kemp is a 79 y.o. male who presents today for electrophysiology evaluation.   Doing very well.  Limited by charcot Lelan Pons Tooth issues.  Cath from Feb is reviewed.  No ischemic symptoms.  No symptoms of arrhythmia. Today, he denies symptoms of palpitations, chest pain, shortness of breath, orthopnea, PND, lower extremity edema, claudication, dizziness, presyncope, syncope, bleeding, or neurologic sequela. The patient is tolerating medications without difficulties and is otherwise without complaint today.    Past Medical History  Diagnosis Date  . DM2 (diabetes mellitus, type 2)   . Obesity   . HTN (hypertension)   . HLD (hyperlipidemia)   . Nephrolithiasis   . Charcot Marie Tooth muscular atrophy     wears braces on his legs  . Second degree Mobitz II AV block   . S/P cardiac pacemaker procedure 11/12/2013  . Hyperlipemia 11/12/2013  . Cardiac pacemaker in situ    Past Surgical History  Procedure Laterality Date  . Kidney stone surgery    . Shoulder arthroscopy    . Pacemaker insertion  11/30/11    SJM Accent DR RF implanted by Dr Rayann Heman  . Permanent pacemaker insertion N/A 11/30/2011    Procedure: PERMANENT PACEMAKER INSERTION;  Surgeon: Thompson Grayer, MD;  Location: [facility] CATH LAB;  Service: Cardiovascular;  Laterality: N/A;  . Left heart catheterization with coronary angiogram N/A 02/11/2015    Procedure: LEFT HEART CATHETERIZATION WITH CORONARY ANGIOGRAM;  Surgeon: Candee Furbish, MD;  Location: [facility] CATH LAB;  Service: Cardiovascular;  Laterality: N/A;     Current Outpatient Prescriptions  Medication Sig Dispense Refill  . aspirin 81 MG tablet Take 81 mg by mouth daily.       Marland Kitchen atorvastatin (LIPITOR) 20 MG tablet Take 20 mg by mouth daily.     Marland Kitchen LEVEMIR FLEXTOUCH 100 UNIT/ML Pen Inject 50 Units into the skin daily.     Marland Kitchen lisinopril (PRINIVIL,ZESTRIL) 20 MG tablet Take 1 tablet by mouth daily.    . Multiple Vitamin (MULTIVITAMIN) capsule Take 1 capsule by mouth daily.      [name] tablet Take 1,250 mg by mouth 2 (two) times daily.      No current facility-administered medications for this visit.    Allergies:   Rivaroxaban; Canagliflozin; Isradipine; Linagliptin; Linagliptin-metformin hcl; Metformin and related; Niacin and related; and Pioglitazone   Social History:  The patient  reports that he has never smoked. He does not have any smokeless tobacco history on file. He reports that he drinks alcohol. He reports that he does not use illicit drugs.   Family History:  The patient's family history includes Cancer in his father, mother, and another family member; Diabetes in an other family member.    ROS:  Please see the history of present illness.   All other systems are reviewed and negative.    PHYSICAL EXAM: VS:  BP 120/60 mmHg  Pulse 85  Ht 6' (1.829 m)  Wt 211 lb 12.8 oz (96.072 kg)  BMI 28.72 kg/m2 , BMI Body mass index is 28.72 kg/(m^2). GEN: Well nourished, well developed, in no acute distress HEENT: normal Neck: no JVD, carotid  bruits, or masses Cardiac: RRR; no murmurs, rubs, or gallops,no edema  Respiratory:  clear to auscultation bilaterally, normal work of breathing GI: soft, nontender, nondistended, + BS MS: L leg brace in place, slow and unsteady gait Skin: warm and dry, device pocket is well healed Neuro:  Strength and sensation are intact Psych: euthymic mood, full affect  Device interrogation is reviewed today in detail.  See PaceArt for details.   Recent Labs: 02/04/2015: BUN 21; Creatinine 1.21; Hemoglobin 14.8; Platelets 190.0; Potassium 4.6; Sodium 138    Lipid Panel  No results found for: CHOL, TRIG, HDL,  CHOLHDL, VLDL, LDLCALC, LDLDIRECT   Wt Readings from Last 3 Encounters:  03/29/15 211 lb 12.8 oz (96.072 kg)  01/21/15 206 lb (93.441 kg)  12/31/14 197 lb (89.359 kg)      Other studies Reviewed: Additional studies/ records that were reviewed today include: Dr Marlou Porch notes   ASSESSMENT AND PLAN:  1. Complete heart block Normal pacemaker function See Pace Art report No changes today  2. Atrial tachycardia Mode switches are reviewed today and appear more consistent with atrial tachycardia than afib.  Longest is 3 minutes and he is asymptomatic. No changes today chads2vasc score is at least 4.  Consider anticoagulation if he has afib going forward.  3. HTn Stable No change required today  No cellular service at his home and thus cannot do remote monitoring Device clinic in 6 months Return to see Chanetta Marshall NP in 1 year   Current medicines are reviewed at length with the patient today.   The patient does not have concerns regarding his medicines.  The following changes were made today:  none  Signed, Thompson Grayer, MD  03/29/2015 3:49 PM     [place] [address] [phone] (office) [phone] (fax)

## 2015-04-12 DIAGNOSIS — M79672 Pain in left foot: Secondary | ICD-10-CM | POA: Diagnosis not present

## 2015-04-15 DIAGNOSIS — G600 Hereditary motor and sensory neuropathy: Secondary | ICD-10-CM | POA: Diagnosis not present

## 2015-04-15 DIAGNOSIS — M6702 Short Achilles tendon (acquired), left ankle: Secondary | ICD-10-CM | POA: Diagnosis not present

## 2015-04-28 ENCOUNTER — Ambulatory Visit (INDEPENDENT_AMBULATORY_CARE_PROVIDER_SITE_OTHER): Payer: Medicare Other | Admitting: Podiatrist

## 2015-04-28 ENCOUNTER — Encounter: Payer: Self-pay | Admitting: Podiatrist

## 2015-04-28 VITALS — BP 123/74 | HR 74 | Resp 12

## 2015-04-28 DIAGNOSIS — M79676 Pain in unspecified toe(s): Secondary | ICD-10-CM

## 2015-04-28 DIAGNOSIS — B351 Tinea unguium: Secondary | ICD-10-CM

## 2015-04-28 NOTE — Progress Notes (Signed)
   Subjective:    Patient ID: Calvin Kemp, male    DOB: 1936-04-09, 79 y.o.   MRN: [id number]  HPI  PT STATED LT FOOT GREAT TOENAIL IS THICK AND DISCOLORATION FOR 50+YEARS. THE TOENAIL IS BEEN THE SAME BUT NOT WORSE AND ITS HARD TO CUT IT. TRIED NO TREATMENT.  Review of Systems  Skin: Positive for color change.       Objective:   Physical Exam Physical Exam GENERAL APPEARANCE: Alert, conversant. Appropriately groomed. No acute distress.  VASCULAR: Pedal pulses palpable and strong bilateral.  Capillary refill time is immediate to all digits,  Proximal to distal cooling it warm to warm.  Digital hair growth is present bilateral  NEUROLOGIC: sensation is intact epicritically and protectively to 5.07 monofilament at 5/5 sites bilateral.  Light touch is intact bilateral, vibratory sensation intact bilateral, achilles tendon reflex is intact bilateral.  MUSCULOSKELETAL: acceptable muscle strength, tone and stability bilateral.  Intrinsic muscluature intact bilateral.  Rectus appearance of foot and digits noted bilateral.    dermatological:  Left hallux nail is thick and discolored.  Pincer nail deformity is present at the distal tip of the toe.  Assessment & Plan:  Symptomatic toenails  Plan:  Debridement of toenail accomplished today without complication. He will be seen back in the future as needed.

## 2015-04-30 ENCOUNTER — Telehealth: Payer: Self-pay | Admitting: Cardiology

## 2015-04-30 NOTE — Telephone Encounter (Signed)
Spoke with pt.  He has a form for the [place] that needs to be filled out about his heart condition.  He will bring it by next week.

## 2015-04-30 NOTE — Telephone Encounter (Signed)
New Message  Pt wanted to go over paperwork from the [place] w/ Dr. Marlou Porch. Please call back and dsicuss.

## 2015-05-04 ENCOUNTER — Telehealth: Payer: Self-pay | Admitting: Cardiology

## 2015-05-04 NOTE — Telephone Encounter (Signed)
Walk in pt form-[employer]-paper dropped off gave to [employer].

## 2015-05-10 ENCOUNTER — Telehealth: Payer: Self-pay | Admitting: Cardiology

## 2015-05-10 NOTE — Telephone Encounter (Signed)
Spoke with patient he is aware [employer] form is completed and ready for his pick up.

## 2015-05-10 NOTE — Telephone Encounter (Signed)
[employer] picked up by Pt.

## 2015-05-20 DIAGNOSIS — E119 Type 2 diabetes mellitus without complications: Secondary | ICD-10-CM | POA: Diagnosis not present

## 2015-06-15 DIAGNOSIS — E1142 Type 2 diabetes mellitus with diabetic polyneuropathy: Secondary | ICD-10-CM | POA: Diagnosis not present

## 2015-06-15 DIAGNOSIS — Z794 Long term (current) use of insulin: Secondary | ICD-10-CM | POA: Diagnosis not present

## 2015-06-15 DIAGNOSIS — E1165 Type 2 diabetes mellitus with hyperglycemia: Secondary | ICD-10-CM | POA: Diagnosis not present

## 2015-06-28 ENCOUNTER — Other Ambulatory Visit: Payer: Self-pay | Admitting: Gastroenterology

## 2015-06-28 DIAGNOSIS — K5730 Diverticulosis of large intestine without perforation or abscess without bleeding: Secondary | ICD-10-CM | POA: Diagnosis not present

## 2015-06-28 DIAGNOSIS — K621 Rectal polyp: Secondary | ICD-10-CM | POA: Diagnosis not present

## 2015-06-28 DIAGNOSIS — D126 Benign neoplasm of colon, unspecified: Secondary | ICD-10-CM | POA: Diagnosis not present

## 2015-06-28 DIAGNOSIS — Z1211 Encounter for screening for malignant neoplasm of colon: Secondary | ICD-10-CM | POA: Diagnosis not present

## 2015-06-28 DIAGNOSIS — D128 Benign neoplasm of rectum: Secondary | ICD-10-CM | POA: Diagnosis not present

## 2015-09-22 DIAGNOSIS — F4320 Adjustment disorder, unspecified: Secondary | ICD-10-CM | POA: Diagnosis not present

## 2015-09-22 DIAGNOSIS — E1165 Type 2 diabetes mellitus with hyperglycemia: Secondary | ICD-10-CM | POA: Diagnosis not present

## 2015-09-22 DIAGNOSIS — E1142 Type 2 diabetes mellitus with diabetic polyneuropathy: Secondary | ICD-10-CM | POA: Diagnosis not present

## 2015-09-22 DIAGNOSIS — Z794 Long term (current) use of insulin: Secondary | ICD-10-CM | POA: Diagnosis not present

## 2015-10-06 ENCOUNTER — Ambulatory Visit (INDEPENDENT_AMBULATORY_CARE_PROVIDER_SITE_OTHER): Payer: Medicare Other | Admitting: *Deleted

## 2015-10-06 DIAGNOSIS — I442 Atrioventricular block, complete: Secondary | ICD-10-CM | POA: Diagnosis not present

## 2015-10-06 LAB — CUP PACEART INCLINIC DEVICE CHECK
Date Time Interrogation Session: 20161019141348
Implantable Lead Implant Date: 20121213
Implantable Lead Implant Date: 20121213
Implantable Lead Location: 753860
Lead Channel Impedance Value: 437.5 Ohm
Lead Channel Pacing Threshold Amplitude: 0.75 V
Lead Channel Pacing Threshold Amplitude: 0.875 V
Lead Channel Pacing Threshold Pulse Width: 0.5 ms
Lead Channel Pacing Threshold Pulse Width: 0.5 ms
Lead Channel Sensing Intrinsic Amplitude: 1.8 mV
Lead Channel Setting Pacing Amplitude: 1.125
Lead Channel Setting Pacing Amplitude: 2 V
Lead Channel Setting Pacing Pulse Width: 0.5 ms
Lead Channel Setting Sensing Sensitivity: 2.5 mV
MDC IDC LEAD LOCATION: 753860
MDC IDC MSMT BATTERY REMAINING LONGEVITY: 114
MDC IDC MSMT BATTERY VOLTAGE: 2.95 V
MDC IDC MSMT LEADCHNL RA IMPEDANCE VALUE: 412.5 Ohm
MDC IDC MSMT LEADCHNL RA PACING THRESHOLD AMPLITUDE: 0.75 V
MDC IDC MSMT LEADCHNL RA PACING THRESHOLD PULSEWIDTH: 0.5 ms
MDC IDC MSMT LEADCHNL RV SENSING INTR AMPL: 10.3 mV
MDC IDC STAT BRADY RA PERCENT PACED: 10 %
MDC IDC STAT BRADY RV PERCENT PACED: 99.98 %
Pulse Gen Model: 2210
Pulse Gen Serial Number: 7309337

## 2015-10-06 NOTE — Progress Notes (Signed)
Pacemaker check in clinic. Normal device function. Thresholds, sensing, impedances consistent with previous measurements. Device programmed to maximize longevity. (16) mode switches (<1%)---max dur. 2 mins, Max A 199, Max V 121, last 10/11---AT per EGMs. No high ventricular rates noted. Device programmed at appropriate safety margins. Histogram distribution appropriate for patient activity level. Device programmed to optimize intrinsic conduction. Estimated longevity 8.8-9.5 years. Patient will follow up with JA in 03-2016.

## 2015-10-28 ENCOUNTER — Encounter: Payer: Self-pay | Admitting: Internal Medicine

## 2016-02-16 DIAGNOSIS — Z950 Presence of cardiac pacemaker: Secondary | ICD-10-CM | POA: Diagnosis not present

## 2016-02-16 DIAGNOSIS — Z111 Encounter for screening for respiratory tuberculosis: Secondary | ICD-10-CM | POA: Diagnosis not present

## 2016-03-29 DIAGNOSIS — E1142 Type 2 diabetes mellitus with diabetic polyneuropathy: Secondary | ICD-10-CM | POA: Diagnosis not present

## 2016-03-29 DIAGNOSIS — Z794 Long term (current) use of insulin: Secondary | ICD-10-CM | POA: Diagnosis not present

## 2016-03-29 DIAGNOSIS — E1165 Type 2 diabetes mellitus with hyperglycemia: Secondary | ICD-10-CM | POA: Diagnosis not present

## 2016-05-03 DIAGNOSIS — R197 Diarrhea, unspecified: Secondary | ICD-10-CM | POA: Diagnosis not present

## 2016-05-03 DIAGNOSIS — E1165 Type 2 diabetes mellitus with hyperglycemia: Secondary | ICD-10-CM | POA: Diagnosis not present

## 2016-05-03 DIAGNOSIS — Z794 Long term (current) use of insulin: Secondary | ICD-10-CM | POA: Diagnosis not present

## 2016-05-03 DIAGNOSIS — E1142 Type 2 diabetes mellitus with diabetic polyneuropathy: Secondary | ICD-10-CM | POA: Diagnosis not present

## 2016-05-05 DIAGNOSIS — R05 Cough: Secondary | ICD-10-CM | POA: Diagnosis not present

## 2016-05-11 NOTE — Progress Notes (Signed)
Electrophysiology Office Note Date: 05/12/2016  ID:  SAVAGE ARTRIP, DOB 03-22-1936, MRN [id number]  PCP:  Melinda Crutch, MD Primary Cardiologist: Marlou Porch Electrophysiologist: Allred  CC: Pacemaker follow-up  Calvin Kemp is a 80 y.o. male seen today for Dr Rayann Heman.  He presents today for routine electrophysiology followup.  Since last being seen in our clinic, the patient reports doing very well.  He denies chest pain, palpitations, dyspnea, PND, orthopnea, nausea, vomiting, dizziness, syncope, edema, weight gain, or early satiety.  Device History: STJ dual chamber PPM implanted 2012 for Mobitz II    Past Medical History  Diagnosis Date  . DM2 (diabetes mellitus, type 2) ([place])   . Obesity   . HTN (hypertension)   . HLD (hyperlipidemia)   . Nephrolithiasis   . Charcot Marie Tooth muscular atrophy     wears braces on his legs  . Second degree Mobitz II AV block   . S/P cardiac pacemaker procedure 11/12/2013  . Hyperlipemia 11/12/2013  . Cardiac pacemaker in situ    Past Surgical History  Procedure Laterality Date  . Kidney stone surgery    . Shoulder arthroscopy    . Pacemaker insertion  11/30/11    SJM Accent DR RF implanted by Dr Rayann Heman  . Permanent pacemaker insertion N/A 11/30/2011    Procedure: PERMANENT PACEMAKER INSERTION;  Surgeon: Thompson Grayer, MD;  Location: [facility] CATH LAB;  Service: Cardiovascular;  Laterality: N/A;  . Left heart catheterization with coronary angiogram N/A 02/11/2015    Procedure: LEFT HEART CATHETERIZATION WITH CORONARY ANGIOGRAM;  Surgeon: Candee Furbish, MD;  Location: [facility] CATH LAB;  Service: Cardiovascular;  Laterality: N/A;    Current Outpatient Prescriptions  Medication Sig Dispense Refill  . aspirin 81 MG tablet Take 81 mg by mouth daily.      . B Complex-C (B-COMPLEX WITH VITAMIN C) tablet Take 1 tablet by mouth daily.    Marland Kitchen LEVEMIR FLEXTOUCH 100 UNIT/ML Pen Inject 80 Units into the skin daily.     Marland Kitchen lisinopril (PRINIVIL,ZESTRIL) 20 MG  tablet Take 1 tablet by mouth daily.    . Multiple Vitamin (MULTIVITAMIN) capsule Take 1 capsule by mouth daily.      [name] tablet Take 1,250 mg by mouth 2 (two) times daily.      No current facility-administered medications for this visit.    Allergies:   Rivaroxaban; Canagliflozin; Isradipine; Linagliptin; Linagliptin-metformin hcl; Metformin and related; Niacin; Niacin and related; and Pioglitazone   Social History: Social History   Social History  . Marital Status: Married    Spouse Name: N/A  . Number of Children: N/A  . Years of Education: N/A   Occupational History  . teacher Nurse, adult   Social History Main Topics  . Smoking status: Never Smoker   . Smokeless tobacco: Not on file  . Alcohol Use: Yes     Comment: 1- 2 a week.  . Drug Use: No  . Sexual Activity: Not on file   Other Topics Concern  . Not on file   Social History Narrative   Lives with spouse.  Children are grown and healthy.   Retired from [employer] and now Psychiatric nurse at [employer].    Family History: Family History  Problem Relation Age of Onset  . Cancer    . Diabetes    . Cancer Mother   . Cancer Father   . Heart attack Neg Hx  Review of Systems: All other systems reviewed and are otherwise negative except as noted above.   Physical Exam: VS:  BP 140/70 mmHg  Pulse 68  Ht 6' (1.829 m)  Wt 204 lb 12.8 oz (92.897 kg)  BMI 27.77 kg/m2 , BMI Body mass index is 27.77 kg/(m^2).  GEN- The patient is well appearing, alert and oriented x 3 today.   HEENT: normocephalic, atraumatic; sclera clear, conjunctiva pink; hearing intact; oropharynx clear; neck supple, no JVP Lymph- no cervical lymphadenopathy Lungs- Clear to ausculation bilaterally, normal work of breathing.  No wheezes, rales, rhonchi Heart- Regular rate and rhythm, no murmurs, rubs or gallops, PMI not laterally displaced GI- soft, non-tender, non-distended, bowel sounds  present, no hepatosplenomegaly Extremities- no clubbing, cyanosis, or edema; DP/PT/radial pulses 2+ bilaterally MS- no significant deformity or atrophy Skin- warm and dry, no rash or lesion; PPM pocket well healed Psych- euthymic mood, full affect Neuro- strength and sensation are intact  PPM Interrogation- reviewed in detail today,  See PACEART report  EKG:  EKG is ordered today. The ekg ordered today shows sinus rhythm with ventricular pacing  Recent Labs: No results found for requested labs within last 365 days.   Wt Readings from Last 3 Encounters:  05/12/16 204 lb 12.8 oz (92.897 kg)  03/29/15 211 lb 12.8 oz (96.072 kg)  02/11/15 208 lb (94.348 kg)     Other studies Reviewed: Additional studies/ records that were reviewed today include: Dr Jackalyn Lombard office notes  Assessment and Plan:  1.  Complete heart block Normal PPM function See Pace Art report No changes today The patient continues to decline remote monitoring Pt is pacemaker dependent today   2.  Atrial tachycardia Burden by device interrogation today <1% No atrial fibrillation identified  3.  HTN Stable No change required today  He is planning on moving to [place] in the next few months. We are available as needed.    Current medicines are reviewed at length with the patient today.   The patient does not have concerns regarding his medicines.  The following changes were made today:  none  Labs/ tests ordered today include: none  Orders Placed This Encounter  Procedures  . EKG 12-Lead     Disposition:   Follow up with [place] in 6 months if still in [place]   Signed, Chanetta Marshall, NP 05/12/2016 1:24 PM  [place] [address] [phone] (office) [phone] (fax)

## 2016-05-12 ENCOUNTER — Ambulatory Visit (INDEPENDENT_AMBULATORY_CARE_PROVIDER_SITE_OTHER): Payer: Medicare Other | Admitting: Nurse Practitioner

## 2016-05-12 ENCOUNTER — Encounter: Payer: Self-pay | Admitting: Internal Medicine

## 2016-05-12 ENCOUNTER — Encounter: Payer: Self-pay | Admitting: Nurse Practitioner

## 2016-05-12 ENCOUNTER — Encounter: Payer: Self-pay | Admitting: Cardiology

## 2016-05-12 ENCOUNTER — Ambulatory Visit (INDEPENDENT_AMBULATORY_CARE_PROVIDER_SITE_OTHER): Payer: Medicare Other | Admitting: Cardiology

## 2016-05-12 VITALS — BP 140/70 | HR 68 | Ht 72.0 in | Wt 204.8 lb

## 2016-05-12 DIAGNOSIS — I10 Essential (primary) hypertension: Secondary | ICD-10-CM | POA: Diagnosis not present

## 2016-05-12 DIAGNOSIS — I480 Paroxysmal atrial fibrillation: Secondary | ICD-10-CM | POA: Diagnosis not present

## 2016-05-12 DIAGNOSIS — I471 Supraventricular tachycardia: Secondary | ICD-10-CM | POA: Diagnosis not present

## 2016-05-12 DIAGNOSIS — I442 Atrioventricular block, complete: Secondary | ICD-10-CM

## 2016-05-12 DIAGNOSIS — Z950 Presence of cardiac pacemaker: Secondary | ICD-10-CM | POA: Diagnosis not present

## 2016-05-12 MED ORDER — ATORVASTATIN CALCIUM 20 MG PO TABS: 20.0000 mg | ORAL_TABLET | Freq: Every day | ORAL | Status: AC

## 2016-05-12 NOTE — Patient Instructions (Signed)
Medication Instructions:   Your physician recommends that you continue on your current medications as directed. Please refer to the Current Medication list given to you today.    If you need a refill on your cardiac medications before your next appointment, please call your pharmacy.  Labwork: NONE ORDER TODAY    Testing/Procedures: NONE ORDER TODAY    Follow-Up: CONTACT [facility] 336 [phone] AS NEEDED FOR  ANY CARDIAC RELATED SYMPTOMS    Any Other Special Instructions Will Be Listed Below (If Applicable).

## 2016-05-12 NOTE — Patient Instructions (Addendum)
Medication Instructions:  Please restart Atorvastatin. Continue all other medications as listed.  Follow-Up: Follow up as needed.  If you need a refill on your cardiac medications before your next appointment, please call your pharmacy.  Thank you for choosing [facility]!!

## 2016-05-12 NOTE — Progress Notes (Signed)
Calvin Kemp. [address], [address], [place]  [address] Phone: [phone] Fax:  [phone]  Date:  05/12/2016   ID:  Calvin Kemp, DOB 27-Mar-1936, MRN [id number]  PCP:   Calvin Crutch, MD   History of Present Illness: Calvin Kemp is a 80 y.o. male with hypertension, hyperlipidemia, diabetes with peripheral neuropathy, who underwent pacemaker implantation after discovery of 2:1 AV block here for followup.  Prior nuclear stress test which was intermediate risk showing 2 defects, mid anteroseptal, distal septal, anterior wall consistent with infarct in the mid to distal LAD region with no ischemia as well as irreversible defect in the mid inferoseptal and inferior walls consistent with prior infarct in the PDA territory with no ischemia. Ejection fraction was 43%. This corroborated with echocardiogram. Trivial aortic valve regurgitation.   He had cardiac catheterization on 02/11/15: LEFT VENTRICULOGRAM: Left ventricular angiogram was done in the 30 RAO projection and revealed mid to distal anteroseptal akinesis with an estimated ejection fraction of 45%.   IMPRESSIONS:  1. 75% distal RCA lesion-continue with medical management, no ischemia on nuclear stress test. Otherwise, minor luminal irregularities. Proximal aneurysmal LAD segment. 2. LVEDP 10 mmHg. Ejection fraction 45%, mid to distal anteroseptal wall akinesis.  RECOMMENDATION: Medical management. Continue with aspirin, atorvastatin, diabetes control. No evidence of ischemia in the inferior territory where lesion exists. Anteroseptal akinesis representative of old infarct pattern with no evidence of stenosis in the LAD.   He is asymptomatic, no prior strokes, no prior heart failure. No current chest pain. He does have diabetes, age, hypertension. 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 Recently retired.    Wt Readings from Last 3 Encounters:  05/12/16 204 lb 12.8 oz (92.897 kg)  05/12/16 204 lb 12.8 oz  (92.897 kg)  03/29/15 211 lb 12.8 oz (96.072 kg)     Past Medical History  Diagnosis Date  . DM2 (diabetes mellitus, type 2) ([place])   . Obesity   . HTN (hypertension)   . HLD (hyperlipidemia)   . Nephrolithiasis   . Charcot Marie Tooth muscular atrophy     wears braces on his legs  . Second degree Mobitz II AV block   . S/P cardiac pacemaker procedure 11/12/2013  . Hyperlipemia 11/12/2013  . Cardiac pacemaker in situ     Past Surgical History  Procedure Laterality Date  . Kidney stone surgery    . Shoulder arthroscopy    . Pacemaker insertion  11/30/11    SJM Accent DR RF implanted by Dr Rayann Heman  . Permanent pacemaker insertion N/A 11/30/2011    Procedure: PERMANENT PACEMAKER INSERTION;  Surgeon: Thompson Grayer, MD;  Location: [facility] CATH LAB;  Service: Cardiovascular;  Laterality: N/A;  . Left heart catheterization with coronary angiogram N/A 02/11/2015    Procedure: LEFT HEART CATHETERIZATION WITH CORONARY ANGIOGRAM;  Surgeon: Candee Furbish, MD;  Location: [facility] CATH LAB;  Service: Cardiovascular;  Laterality: N/A;    Current Outpatient Prescriptions  Medication Sig Dispense Refill  . aspirin 81 MG tablet Take 81 mg by mouth daily.      . B Complex-C (B-COMPLEX WITH VITAMIN C) tablet Take 1 tablet by mouth daily.    Marland Kitchen LEVEMIR FLEXTOUCH 100 UNIT/ML Pen Inject 80 Units into the skin daily.     Marland Kitchen lisinopril (PRINIVIL,ZESTRIL) 20 MG tablet Take 1 tablet by mouth daily.    . Multiple Vitamin (MULTIVITAMIN) capsule Take 1 capsule by mouth daily.      [name]  tablet Take 1,250 mg by mouth 2 (two) times daily.     Marland Kitchen atorvastatin (LIPITOR) 20 MG tablet Take 1 tablet (20 mg total) by mouth daily. Reported on 05/12/2016 90 tablet 3   No current facility-administered medications for this visit.    Allergies:    Allergies  Allergen Reactions  . Rivaroxaban Other (See Comments)    Bleeding gums Bleeding gums  . Canagliflozin Diarrhea  . Isradipine Diarrhea  . Linagliptin Diarrhea    . Linagliptin-Metformin Hcl Diarrhea  . Metformin And Related Diarrhea  . Niacin Diarrhea  . Niacin And Related Diarrhea  . Pioglitazone Diarrhea    Social History:  The patient  reports that he has never smoked. He does not have any smokeless tobacco history on file. He reports that he drinks alcohol. He reports that he does not use illicit drugs.   ROS:  Please see the history of present illness.   No syncope, no bleeding, no chest pain    PHYSICAL EXAM: VS:  BP 140/70 mmHg  Pulse 68  Ht 6' (1.829 m)  Wt 204 lb 12.8 oz (92.897 kg)  BMI 27.77 kg/m2 Well nourished, well developed, in no acute distress HEENT: normal Neck: no JVD Cardiac:  normal S1, S2; RRR; 2/6 SEM murmur Lungs:  clear to auscultation bilaterally, no wheezing, rhonchi or rales Abd: soft, nontender, no hepatomegaly Ext: no edema Skin: warm and dry Neuro: no focal abnormalities noted  EKG: EKG was done today-05/12/16-atrial-ventricular pacing. Heart rate 60. 11/25/14-sinus rhythm, 87, paced left ventricle, left bundle branch block morphology.     ASSESSMENT AND PLAN:  1. Coronary artery disease-distal RCA disease, medical management. See cath report as above. Abnormal nuclear stress test prior to cardiac catheterization. 2. PAF - Brief episode of atrial fibrillation seen on monitor/pacemaker in the past. See discussion above. Dr. Rayann Heman will be interrogating his pacemaker. If further episodes of mode switch are more consistent with atrial fibrillation in the future, consider pushing anticoagulation once again. Previously, the mode switches were reviewed by Dr. Rayann Heman and appear to be more consistent with atrial tachycardia. 3. Hyperlipidemia-triglycerides remain elevated in the 270 range which have been lifelong for him. I have encouraged him to go back on his atorvastatin 20 mg.  His LDL was 73. Continue with low fat diet. I'm fine with him continuing the WelChol if he so chooses. 4. Hypertension-currently reasonably  controlled. Continue to monitor. Continue with ACE inhibitor. Mildly elevated today. Continue to monitor.   He will be moving to [facility]. [facility].  Signed, Candee Furbish, MD [facility]  05/12/2016 1:58 PM

## 2016-05-13 LAB — CUP PACEART INCLINIC DEVICE CHECK
Implantable Lead Implant Date: 20121213
Implantable Lead Implant Date: 20121213
Implantable Lead Location: 753860
MDC IDC LEAD LOCATION: 753860
MDC IDC PG SERIAL: 7309337
MDC IDC SESS DTM: 20170527040854

## 2016-05-22 DIAGNOSIS — E119 Type 2 diabetes mellitus without complications: Secondary | ICD-10-CM | POA: Diagnosis not present

## 2016-09-21 DIAGNOSIS — I10 Essential (primary) hypertension: Secondary | ICD-10-CM | POA: Diagnosis not present

## 2016-09-21 DIAGNOSIS — M1990 Unspecified osteoarthritis, unspecified site: Secondary | ICD-10-CM | POA: Diagnosis not present

## 2016-09-21 DIAGNOSIS — E119 Type 2 diabetes mellitus without complications: Secondary | ICD-10-CM | POA: Diagnosis not present

## 2016-09-21 DIAGNOSIS — I495 Sick sinus syndrome: Secondary | ICD-10-CM | POA: Diagnosis not present

## 2016-09-21 DIAGNOSIS — I480 Paroxysmal atrial fibrillation: Secondary | ICD-10-CM | POA: Diagnosis not present

## 2016-09-21 DIAGNOSIS — N401 Enlarged prostate with lower urinary tract symptoms: Secondary | ICD-10-CM | POA: Diagnosis not present

## 2016-09-21 DIAGNOSIS — E785 Hyperlipidemia, unspecified: Secondary | ICD-10-CM | POA: Diagnosis not present

## 2016-09-28 DIAGNOSIS — E785 Hyperlipidemia, unspecified: Secondary | ICD-10-CM | POA: Diagnosis not present

## 2016-09-28 DIAGNOSIS — Z950 Presence of cardiac pacemaker: Secondary | ICD-10-CM | POA: Diagnosis not present

## 2016-09-28 DIAGNOSIS — I10 Essential (primary) hypertension: Secondary | ICD-10-CM | POA: Diagnosis not present

## 2016-09-28 DIAGNOSIS — I480 Paroxysmal atrial fibrillation: Secondary | ICD-10-CM | POA: Diagnosis not present

## 2016-10-13 DIAGNOSIS — I129 Hypertensive chronic kidney disease with stage 1 through stage 4 chronic kidney disease, or unspecified chronic kidney disease: Secondary | ICD-10-CM | POA: Diagnosis not present

## 2016-10-13 DIAGNOSIS — E1165 Type 2 diabetes mellitus with hyperglycemia: Secondary | ICD-10-CM | POA: Diagnosis not present

## 2016-10-13 DIAGNOSIS — N183 Chronic kidney disease, stage 3 (moderate): Secondary | ICD-10-CM | POA: Diagnosis not present

## 2016-10-13 DIAGNOSIS — E039 Hypothyroidism, unspecified: Secondary | ICD-10-CM | POA: Diagnosis not present

## 2016-10-13 DIAGNOSIS — E785 Hyperlipidemia, unspecified: Secondary | ICD-10-CM | POA: Diagnosis not present

## 2016-10-13 DIAGNOSIS — E162 Hypoglycemia, unspecified: Secondary | ICD-10-CM | POA: Diagnosis not present

## 2016-10-13 DIAGNOSIS — E1122 Type 2 diabetes mellitus with diabetic chronic kidney disease: Secondary | ICD-10-CM | POA: Diagnosis not present

## 2016-10-31 DIAGNOSIS — I480 Paroxysmal atrial fibrillation: Secondary | ICD-10-CM | POA: Diagnosis not present

## 2016-12-28 DIAGNOSIS — E785 Hyperlipidemia, unspecified: Secondary | ICD-10-CM | POA: Diagnosis not present

## 2016-12-28 DIAGNOSIS — I10 Essential (primary) hypertension: Secondary | ICD-10-CM | POA: Diagnosis not present

## 2016-12-28 DIAGNOSIS — Z950 Presence of cardiac pacemaker: Secondary | ICD-10-CM | POA: Diagnosis not present

## 2016-12-28 DIAGNOSIS — I480 Paroxysmal atrial fibrillation: Secondary | ICD-10-CM | POA: Diagnosis not present

## 2017-01-03 DIAGNOSIS — Z7189 Other specified counseling: Secondary | ICD-10-CM | POA: Diagnosis not present

## 2017-01-03 DIAGNOSIS — L821 Other seborrheic keratosis: Secondary | ICD-10-CM | POA: Diagnosis not present

## 2017-01-03 DIAGNOSIS — D1801 Hemangioma of skin and subcutaneous tissue: Secondary | ICD-10-CM | POA: Diagnosis not present

## 2017-01-03 DIAGNOSIS — L728 Other follicular cysts of the skin and subcutaneous tissue: Secondary | ICD-10-CM | POA: Diagnosis not present

## 2017-01-03 DIAGNOSIS — D485 Neoplasm of uncertain behavior of skin: Secondary | ICD-10-CM | POA: Diagnosis not present

## 2017-01-03 DIAGNOSIS — L570 Actinic keratosis: Secondary | ICD-10-CM | POA: Diagnosis not present

## 2017-01-17 DIAGNOSIS — L570 Actinic keratosis: Secondary | ICD-10-CM | POA: Diagnosis not present

## 2017-01-18 DIAGNOSIS — E119 Type 2 diabetes mellitus without complications: Secondary | ICD-10-CM | POA: Diagnosis not present

## 2017-01-18 DIAGNOSIS — N183 Chronic kidney disease, stage 3 (moderate): Secondary | ICD-10-CM | POA: Diagnosis not present

## 2017-01-18 DIAGNOSIS — E785 Hyperlipidemia, unspecified: Secondary | ICD-10-CM | POA: Diagnosis not present

## 2017-01-18 DIAGNOSIS — E039 Hypothyroidism, unspecified: Secondary | ICD-10-CM | POA: Diagnosis not present

## 2017-01-18 DIAGNOSIS — I10 Essential (primary) hypertension: Secondary | ICD-10-CM | POA: Diagnosis not present

## 2017-01-25 DIAGNOSIS — I480 Paroxysmal atrial fibrillation: Secondary | ICD-10-CM | POA: Diagnosis not present

## 2017-02-12 DIAGNOSIS — E1122 Type 2 diabetes mellitus with diabetic chronic kidney disease: Secondary | ICD-10-CM | POA: Diagnosis not present

## 2017-02-12 DIAGNOSIS — E785 Hyperlipidemia, unspecified: Secondary | ICD-10-CM | POA: Diagnosis not present

## 2017-02-12 DIAGNOSIS — E039 Hypothyroidism, unspecified: Secondary | ICD-10-CM | POA: Diagnosis not present

## 2017-02-12 DIAGNOSIS — I129 Hypertensive chronic kidney disease with stage 1 through stage 4 chronic kidney disease, or unspecified chronic kidney disease: Secondary | ICD-10-CM | POA: Diagnosis not present

## 2017-02-12 DIAGNOSIS — E1165 Type 2 diabetes mellitus with hyperglycemia: Secondary | ICD-10-CM | POA: Diagnosis not present

## 2017-02-12 DIAGNOSIS — N183 Chronic kidney disease, stage 3 (moderate): Secondary | ICD-10-CM | POA: Diagnosis not present

## 2017-02-20 DIAGNOSIS — H608X1 Other otitis externa, right ear: Secondary | ICD-10-CM | POA: Diagnosis not present

## 2017-02-20 DIAGNOSIS — H6120 Impacted cerumen, unspecified ear: Secondary | ICD-10-CM | POA: Diagnosis not present

## 2017-04-11 DIAGNOSIS — I495 Sick sinus syndrome: Secondary | ICD-10-CM | POA: Diagnosis not present

## 2017-04-11 DIAGNOSIS — E785 Hyperlipidemia, unspecified: Secondary | ICD-10-CM | POA: Diagnosis not present

## 2017-04-11 DIAGNOSIS — L719 Rosacea, unspecified: Secondary | ICD-10-CM | POA: Diagnosis not present

## 2017-04-11 DIAGNOSIS — M1990 Unspecified osteoarthritis, unspecified site: Secondary | ICD-10-CM | POA: Diagnosis not present

## 2017-04-11 DIAGNOSIS — I480 Paroxysmal atrial fibrillation: Secondary | ICD-10-CM | POA: Diagnosis not present

## 2017-04-11 DIAGNOSIS — L723 Sebaceous cyst: Secondary | ICD-10-CM | POA: Diagnosis not present

## 2017-04-11 DIAGNOSIS — E119 Type 2 diabetes mellitus without complications: Secondary | ICD-10-CM | POA: Diagnosis not present

## 2017-04-11 DIAGNOSIS — I10 Essential (primary) hypertension: Secondary | ICD-10-CM | POA: Diagnosis not present

## 2017-04-19 DIAGNOSIS — L720 Epidermal cyst: Secondary | ICD-10-CM | POA: Diagnosis not present

## 2017-04-20 DIAGNOSIS — I10 Essential (primary) hypertension: Secondary | ICD-10-CM | POA: Diagnosis not present

## 2017-04-20 DIAGNOSIS — M1990 Unspecified osteoarthritis, unspecified site: Secondary | ICD-10-CM | POA: Diagnosis not present

## 2017-04-20 DIAGNOSIS — I480 Paroxysmal atrial fibrillation: Secondary | ICD-10-CM | POA: Diagnosis not present

## 2017-04-20 DIAGNOSIS — E785 Hyperlipidemia, unspecified: Secondary | ICD-10-CM | POA: Diagnosis not present

## 2017-04-20 DIAGNOSIS — I495 Sick sinus syndrome: Secondary | ICD-10-CM | POA: Diagnosis not present

## 2017-04-20 DIAGNOSIS — G600 Hereditary motor and sensory neuropathy: Secondary | ICD-10-CM | POA: Diagnosis not present

## 2017-04-20 DIAGNOSIS — Z01818 Encounter for other preprocedural examination: Secondary | ICD-10-CM | POA: Diagnosis not present

## 2017-04-20 DIAGNOSIS — E119 Type 2 diabetes mellitus without complications: Secondary | ICD-10-CM | POA: Diagnosis not present

## 2017-04-24 DIAGNOSIS — I480 Paroxysmal atrial fibrillation: Secondary | ICD-10-CM | POA: Diagnosis not present

## 2017-05-09 DIAGNOSIS — L720 Epidermal cyst: Secondary | ICD-10-CM | POA: Diagnosis not present

## 2017-05-18 DIAGNOSIS — E109 Type 1 diabetes mellitus without complications: Secondary | ICD-10-CM | POA: Diagnosis not present

## 2017-05-18 DIAGNOSIS — H2513 Age-related nuclear cataract, bilateral: Secondary | ICD-10-CM | POA: Diagnosis not present

## 2017-05-28 DIAGNOSIS — I781 Nevus, non-neoplastic: Secondary | ICD-10-CM | POA: Diagnosis not present

## 2017-05-28 DIAGNOSIS — L720 Epidermal cyst: Secondary | ICD-10-CM | POA: Diagnosis not present

## 2017-05-28 DIAGNOSIS — L821 Other seborrheic keratosis: Secondary | ICD-10-CM | POA: Diagnosis not present

## 2017-05-28 DIAGNOSIS — L309 Dermatitis, unspecified: Secondary | ICD-10-CM | POA: Diagnosis not present

## 2017-06-06 DIAGNOSIS — I129 Hypertensive chronic kidney disease with stage 1 through stage 4 chronic kidney disease, or unspecified chronic kidney disease: Secondary | ICD-10-CM | POA: Diagnosis not present

## 2017-06-06 DIAGNOSIS — E1122 Type 2 diabetes mellitus with diabetic chronic kidney disease: Secondary | ICD-10-CM | POA: Diagnosis not present

## 2017-06-06 DIAGNOSIS — N183 Chronic kidney disease, stage 3 (moderate): Secondary | ICD-10-CM | POA: Diagnosis not present

## 2017-06-26 DIAGNOSIS — I2510 Atherosclerotic heart disease of native coronary artery without angina pectoris: Secondary | ICD-10-CM | POA: Diagnosis not present

## 2017-06-26 DIAGNOSIS — I10 Essential (primary) hypertension: Secondary | ICD-10-CM | POA: Diagnosis not present

## 2017-06-26 DIAGNOSIS — I255 Ischemic cardiomyopathy: Secondary | ICD-10-CM | POA: Diagnosis not present

## 2017-06-26 DIAGNOSIS — I480 Paroxysmal atrial fibrillation: Secondary | ICD-10-CM | POA: Diagnosis not present

## 2017-07-11 DIAGNOSIS — I2510 Atherosclerotic heart disease of native coronary artery without angina pectoris: Secondary | ICD-10-CM | POA: Diagnosis not present

## 2017-07-11 DIAGNOSIS — I255 Ischemic cardiomyopathy: Secondary | ICD-10-CM | POA: Diagnosis not present

## 2017-07-12 DIAGNOSIS — L821 Other seborrheic keratosis: Secondary | ICD-10-CM | POA: Diagnosis not present

## 2017-07-12 DIAGNOSIS — R21 Rash and other nonspecific skin eruption: Secondary | ICD-10-CM | POA: Diagnosis not present

## 2017-07-12 DIAGNOSIS — R5383 Other fatigue: Secondary | ICD-10-CM | POA: Diagnosis not present

## 2017-07-12 DIAGNOSIS — L570 Actinic keratosis: Secondary | ICD-10-CM | POA: Diagnosis not present

## 2017-07-12 DIAGNOSIS — Z118 Encounter for screening for other infectious and parasitic diseases: Secondary | ICD-10-CM | POA: Diagnosis not present

## 2017-07-24 DIAGNOSIS — Z118 Encounter for screening for other infectious and parasitic diseases: Secondary | ICD-10-CM | POA: Diagnosis not present

## 2017-07-24 DIAGNOSIS — R21 Rash and other nonspecific skin eruption: Secondary | ICD-10-CM | POA: Diagnosis not present

## 2017-07-24 DIAGNOSIS — I10 Essential (primary) hypertension: Secondary | ICD-10-CM | POA: Diagnosis not present

## 2017-07-24 DIAGNOSIS — R5383 Other fatigue: Secondary | ICD-10-CM | POA: Diagnosis not present

## 2017-07-24 DIAGNOSIS — E119 Type 2 diabetes mellitus without complications: Secondary | ICD-10-CM | POA: Diagnosis not present

## 2017-07-26 DIAGNOSIS — I480 Paroxysmal atrial fibrillation: Secondary | ICD-10-CM | POA: Diagnosis not present

## 2017-07-31 DIAGNOSIS — I420 Dilated cardiomyopathy: Secondary | ICD-10-CM | POA: Diagnosis not present

## 2017-07-31 DIAGNOSIS — I480 Paroxysmal atrial fibrillation: Secondary | ICD-10-CM | POA: Diagnosis not present

## 2017-07-31 DIAGNOSIS — I10 Essential (primary) hypertension: Secondary | ICD-10-CM | POA: Diagnosis not present

## 2017-07-31 DIAGNOSIS — I2510 Atherosclerotic heart disease of native coronary artery without angina pectoris: Secondary | ICD-10-CM | POA: Diagnosis not present

## 2017-08-07 DIAGNOSIS — M25571 Pain in right ankle and joints of right foot: Secondary | ICD-10-CM | POA: Diagnosis not present

## 2017-08-07 DIAGNOSIS — M25561 Pain in right knee: Secondary | ICD-10-CM | POA: Diagnosis not present

## 2017-08-07 DIAGNOSIS — M25551 Pain in right hip: Secondary | ICD-10-CM | POA: Diagnosis not present

## 2017-08-07 DIAGNOSIS — G600 Hereditary motor and sensory neuropathy: Secondary | ICD-10-CM | POA: Diagnosis not present

## 2017-08-07 DIAGNOSIS — M25572 Pain in left ankle and joints of left foot: Secondary | ICD-10-CM | POA: Diagnosis not present

## 2017-08-15 DIAGNOSIS — H608X3 Other otitis externa, bilateral: Secondary | ICD-10-CM | POA: Diagnosis not present

## 2017-10-04 DIAGNOSIS — M79672 Pain in left foot: Secondary | ICD-10-CM | POA: Diagnosis not present

## 2017-10-04 DIAGNOSIS — G600 Hereditary motor and sensory neuropathy: Secondary | ICD-10-CM | POA: Diagnosis not present

## 2017-10-04 DIAGNOSIS — M21371 Foot drop, right foot: Secondary | ICD-10-CM | POA: Diagnosis not present

## 2017-10-04 DIAGNOSIS — M79671 Pain in right foot: Secondary | ICD-10-CM | POA: Diagnosis not present

## 2017-10-08 DIAGNOSIS — D1801 Hemangioma of skin and subcutaneous tissue: Secondary | ICD-10-CM | POA: Diagnosis not present

## 2017-10-08 DIAGNOSIS — L570 Actinic keratosis: Secondary | ICD-10-CM | POA: Diagnosis not present

## 2017-10-08 DIAGNOSIS — L02223 Furuncle of chest wall: Secondary | ICD-10-CM | POA: Diagnosis not present

## 2017-10-08 DIAGNOSIS — L821 Other seborrheic keratosis: Secondary | ICD-10-CM | POA: Diagnosis not present

## 2017-10-08 DIAGNOSIS — L239 Allergic contact dermatitis, unspecified cause: Secondary | ICD-10-CM | POA: Diagnosis not present

## 2017-10-08 DIAGNOSIS — L02222 Furuncle of back [any part, except buttock]: Secondary | ICD-10-CM | POA: Diagnosis not present

## 2017-10-11 DIAGNOSIS — E785 Hyperlipidemia, unspecified: Secondary | ICD-10-CM | POA: Diagnosis not present

## 2017-10-11 DIAGNOSIS — E119 Type 2 diabetes mellitus without complications: Secondary | ICD-10-CM | POA: Diagnosis not present

## 2017-10-15 DIAGNOSIS — E785 Hyperlipidemia, unspecified: Secondary | ICD-10-CM | POA: Diagnosis not present

## 2017-10-15 DIAGNOSIS — E1165 Type 2 diabetes mellitus with hyperglycemia: Secondary | ICD-10-CM | POA: Diagnosis not present

## 2017-10-15 DIAGNOSIS — I10 Essential (primary) hypertension: Secondary | ICD-10-CM | POA: Diagnosis not present

## 2017-10-25 DIAGNOSIS — I480 Paroxysmal atrial fibrillation: Secondary | ICD-10-CM | POA: Diagnosis not present

## 2017-11-28 DIAGNOSIS — I420 Dilated cardiomyopathy: Secondary | ICD-10-CM | POA: Diagnosis not present

## 2017-11-28 DIAGNOSIS — I2510 Atherosclerotic heart disease of native coronary artery without angina pectoris: Secondary | ICD-10-CM | POA: Diagnosis not present

## 2017-11-28 DIAGNOSIS — E785 Hyperlipidemia, unspecified: Secondary | ICD-10-CM | POA: Diagnosis not present

## 2017-11-28 DIAGNOSIS — I10 Essential (primary) hypertension: Secondary | ICD-10-CM | POA: Diagnosis not present

## 2018-10-03 ENCOUNTER — Encounter: Payer: Self-pay | Admitting: Internal Medicine

## 2019-05-01 ENCOUNTER — Telehealth: Payer: Self-pay | Admitting: Cardiology

## 2019-05-01 NOTE — Telephone Encounter (Signed)
Attempted to call pt due to lost to follow up. No answer / number in busy.
